# Patient Record
Sex: Male | Born: 1950
Health system: Southern US, Community
[De-identification: ages and names within clinical notes are randomized; demographics above are authoritative.]

## PROBLEM LIST (undated history)

## (undated) DIAGNOSIS — I10 Essential (primary) hypertension: Secondary | ICD-10-CM

## (undated) DIAGNOSIS — E7800 Pure hypercholesterolemia, unspecified: Secondary | ICD-10-CM

---

## 2014-02-24 ENCOUNTER — Encounter (HOSPITAL_COMMUNITY): Payer: Self-pay | Admitting: Emergency Medicine

## 2014-02-24 ENCOUNTER — Inpatient Hospital Stay (HOSPITAL_COMMUNITY)
Admission: EM | Admit: 2014-02-24 | Discharge: 2014-03-03 | DRG: 176 | Disposition: A | Discharge status: Home / Self Care | Payer: Self-pay | Attending: Internal Medicine | Admitting: Internal Medicine

## 2014-02-24 ENCOUNTER — Emergency Department (HOSPITAL_COMMUNITY): Payer: Self-pay

## 2014-02-24 DIAGNOSIS — R509 Fever, unspecified: Secondary | ICD-10-CM

## 2014-02-24 DIAGNOSIS — F17200 Nicotine dependence, unspecified, uncomplicated: Secondary | ICD-10-CM | POA: Diagnosis present

## 2014-02-24 DIAGNOSIS — I824Y9 Acute embolism and thrombosis of unspecified deep veins of unspecified proximal lower extremity: Secondary | ICD-10-CM | POA: Diagnosis present

## 2014-02-24 DIAGNOSIS — D72829 Elevated white blood cell count, unspecified: Secondary | ICD-10-CM

## 2014-02-24 DIAGNOSIS — I2699 Other pulmonary embolism without acute cor pulmonale: Principal | ICD-10-CM

## 2014-02-24 DIAGNOSIS — I82412 Acute embolism and thrombosis of left femoral vein: Secondary | ICD-10-CM

## 2014-02-24 DIAGNOSIS — I82419 Acute embolism and thrombosis of unspecified femoral vein: Secondary | ICD-10-CM

## 2014-02-24 LAB — CBC
HCT: 39 % (ref 39.0–52.0)
HEMOGLOBIN: 13.1 g/dL (ref 13.0–17.0)
MCH: 29.7 pg (ref 26.0–34.0)
MCHC: 33.6 g/dL (ref 30.0–36.0)
MCV: 88.4 fL (ref 78.0–100.0)
PLATELETS: 370 10*3/uL (ref 150–400)
RBC: 4.41 MIL/uL (ref 4.22–5.81)
RDW: 15.3 % (ref 11.5–15.5)
WBC: 13.5 10*3/uL — ABNORMAL HIGH (ref 4.0–10.5)

## 2014-02-24 LAB — URINE MICROSCOPIC-ADD ON

## 2014-02-24 LAB — BASIC METABOLIC PANEL
ANION GAP: 19 — AB (ref 5–15)
BUN: 7 mg/dL (ref 6–23)
CALCIUM: 9.2 mg/dL (ref 8.4–10.5)
CO2: 22 mEq/L (ref 19–32)
Chloride: 96 mEq/L (ref 96–112)
Creatinine, Ser: 1.09 mg/dL (ref 0.50–1.35)
GFR, EST AFRICAN AMERICAN: 83 mL/min — AB (ref >90)
GFR, EST NON AFRICAN AMERICAN: 71 mL/min — AB (ref >90)
Glucose, Bld: 101 mg/dL — ABNORMAL HIGH (ref 70–99)
POTASSIUM: 3.9 meq/L (ref 3.7–5.3)
SODIUM: 137 meq/L (ref 137–147)

## 2014-02-24 LAB — URINALYSIS, ROUTINE W REFLEX MICROSCOPIC
BILIRUBIN URINE: NEGATIVE
Glucose, UA: NEGATIVE mg/dL
KETONES UR: 15 mg/dL — AB
Leukocytes, UA: NEGATIVE
NITRITE: NEGATIVE
Protein, ur: NEGATIVE mg/dL
Specific Gravity, Urine: 1.018 (ref 1.005–1.030)
UROBILINOGEN UA: 2 mg/dL — AB (ref 0.0–1.0)
pH: 5.5 (ref 5.0–8.0)

## 2014-02-24 LAB — D-DIMER, QUANTITATIVE (NOT AT ARMC): D-Dimer, Quant: 3.37 ug/mL-FEU — ABNORMAL HIGH (ref 0.00–0.48)

## 2014-02-24 LAB — I-STAT TROPONIN, ED: TROPONIN I, POC: 0 ng/mL (ref 0.00–0.08)

## 2014-02-24 MED ORDER — HEPARIN (PORCINE) IN NACL 100-0.45 UNIT/ML-% IJ SOLN
1500.0000 [IU]/h | INTRAMUSCULAR | Status: DC
  Administered 2014-02-24: 1500 [IU]/h via INTRAVENOUS
  Filled 2014-02-24 (×2): qty 250

## 2014-02-24 MED ORDER — SODIUM CHLORIDE 0.9 % IV BOLUS (SEPSIS)
1000.0000 mL | Freq: Once | INTRAVENOUS | Status: AC
  Administered 2014-02-24: 1000 mL via INTRAVENOUS

## 2014-02-24 MED ORDER — HEPARIN BOLUS VIA INFUSION
5000.0000 [IU] | Freq: Once | INTRAVENOUS | Status: AC
  Administered 2014-02-24: 5000 [IU] via INTRAVENOUS
  Filled 2014-02-24: qty 5000

## 2014-02-24 MED ORDER — IOHEXOL 350 MG/ML SOLN
100.0000 mL | Freq: Once | INTRAVENOUS | Status: AC | PRN
  Administered 2014-02-24: 100 mL via INTRAVENOUS

## 2014-02-24 MED ORDER — DIAZEPAM 5 MG/ML IJ SOLN
5.0000 mg | Freq: Once | INTRAMUSCULAR | Status: AC
  Administered 2014-02-24: 5 mg via INTRAVENOUS
  Filled 2014-02-24: qty 2

## 2014-02-24 NOTE — ED Notes (Signed)
Presents with right flank/upper back pain began 2 days ago described as squeezing and breath catching. Pain is worse with movement and feels like "something is grabbing my ribs or lungs and squeezing"  Associate dwith SOB. Denies cough. bilaterral breath sounds clear. Denies nausea and vomiting. Sitting up straight makes pain better.

## 2014-02-24 NOTE — ED Notes (Signed)
PT reports flank pain is non-tender unless you push very hard. Non-tender upon assessment

## 2014-02-24 NOTE — Progress Notes (Signed)
ANTICOAGULATION CONSULT NOTE - Initial Consult  Pharmacy Consult for Heparin  Indication: pulmonary embolus  No Known Allergies  Patient Measurements: Weight: 262 lb (118.842 kg) Heparin Dosing Weight:   Vital Signs: Temp: 100 F (37.8 C) (07/07 1643) Temp src: Oral (07/07 1643) BP: 155/72 mmHg (07/07 2255) Pulse Rate: 88 (07/07 2255)  Labs:  Recent Labs  02/24/14 1706  HGB 13.1  HCT 39.0  PLT 370  CREATININE 1.09    Assessment: [name] with right flank pain, upper back pain x 2 days. + D-dimer, awaiting CT Angio, other labs as above.   Goal of Therapy:  Heparin level 0.3-0.7 units/ml Monitor platelets by anticoagulation protocol: Yes   Plan:  -Heparin 5000 units BOLUS  -Start heparin drip at 1500 units/hr -0700 HL -Daily CBC/HL -Monitor for bleeding  Jeffery Gamble, Jeffery Gamble 02/24/2014,11:24 PM

## 2014-02-24 NOTE — ED Notes (Signed)
Patient arrived back in room, hooked back up to the monitor and he states his pain has decreased from 10/10 to 4/10 and no complaints of shortness of breath.

## 2014-02-24 NOTE — ED Provider Notes (Signed)
CSN: [id number]     Arrival date & time 02/24/14  1636 History   First MD Initiated Contact with Patient 02/24/14 2009     Chief Complaint  Patient presents with  . Flank Pain  . Shortness of Breath     (Consider location/radiation/quality/duration/timing/severity/associated sxs/prior Treatment) HPI Comments: Patient is a 63 year old male past medical history significant for tobacco use presented to the emergency department for 2 days of right lateral chest pain that he describes as stabbing and squeezing. Patient states he has associated episodes of difficulty breathing due to the pain, he denies any shortness of breath otherwise. 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 He states the leg swelling has resolved. Medications tried prior to arrival: none. Denies any fevers, chills, nausea, vomiting, abdominal pain, diarrhea. No known cardiac history.   Patient is a 63 y.o. male presenting with flank pain and shortness of breath.  Flank Pain Associated symptoms include chest pain.  Shortness of Breath Associated symptoms: chest pain     History reviewed. No pertinent past medical history. History reviewed. No pertinent past surgical history. History reviewed. No pertinent family history. History  Substance Use Topics  . Smoking status: Current Every Day Smoker    Types: Cigarettes  . Smokeless tobacco: Not on file  . Alcohol Use: Yes    Review of Systems  Respiratory: Positive for shortness of breath.   Cardiovascular: Positive for chest pain.  All other systems reviewed and are negative.     Allergies  Review of patient's allergies indicates no known allergies.  Home Medications   Prior to Admission medications   Medication Sig Start Date End Date Taking? Authorizing Provider  oxymetazoline (AFRIN) 0.05 % nasal spray Place 2 sprays into both nostrils  daily as needed for congestion.   Yes Historical Provider, MD   BP 175/77  Pulse 88  Temp(Src) 100 F (37.8 C) (Oral)  Resp 27  Wt 262 lb (118.842 kg)  SpO2 93% Physical Exam  Vitals reviewed. Constitutional: He is oriented to person, place, and time. He appears well-developed and well-nourished. No distress.  HENT:  Head: Normocephalic and atraumatic.  Right Ear: External ear normal.  Left Ear: External ear normal.  Nose: Nose normal.  Mouth/Throat: Oropharynx is clear and moist. No oropharyngeal exudate.  Eyes: Conjunctivae are normal.  Neck: Neck supple.  Cardiovascular: Normal rate, regular rhythm, normal heart sounds and intact distal pulses.   Pulmonary/Chest: Effort normal and breath sounds normal. He exhibits tenderness.    Abdominal: Soft. Bowel sounds are normal. There is no tenderness.  Musculoskeletal: Normal range of motion. He exhibits no edema.       Back:  Lymphadenopathy:    He has no cervical adenopathy.  Neurological: He is alert and oriented to person, place, and time.  Skin: Skin is warm and dry. He is not diaphoretic.    ED Course  Procedures (including critical care time) Medications  heparin ADULT infusion 100 units/mL (25000 units/250 mL) (1,500 Units/hr Intravenous New Bag/Given 02/24/14 2358)  diazepam (VALIUM) injection 5 mg (5 mg Intravenous Given 02/24/14 2212)  sodium chloride 0.9 % bolus 1,000 mL (0 mLs Intravenous Stopped 02/24/14 2359)  iohexol (OMNIPAQUE) 350 MG/ML injection 100 mL (100 mLs Intravenous Contrast Given 02/24/14 2301)  heparin bolus via infusion 5,000 Units (5,000 Units Intravenous New Bag/Given 02/24/14 2359)    Labs Review  Labs Reviewed  CBC - Abnormal; Notable for the following:    WBC 13.5 (*)    All other components within normal limits  BASIC METABOLIC PANEL - Abnormal; Notable for the following:    Glucose, Bld 101 (*)    GFR calc non Af Amer 71 (*)    GFR calc Af Amer 83 (*)    Anion gap 19 (*)    All other  components within normal limits  URINALYSIS, ROUTINE W REFLEX MICROSCOPIC - Abnormal; Notable for the following:    Hgb urine dipstick SMALL (*)    Ketones, ur 15 (*)    Urobilinogen, UA 2.0 (*)    All other components within normal limits  D-DIMER, QUANTITATIVE - Abnormal; Notable for the following:    D-Dimer, Quant 3.37 (*)    All other components within normal limits  URINE CULTURE  URINE MICROSCOPIC-ADD ON  HEPARIN LEVEL (UNFRACTIONATED)  CBC  I-STAT TROPOININ, ED    Imaging Review Dg Chest 2 View  02/24/2014   CLINICAL DATA:  Chest and back pain.  EXAM: CHEST  2 VIEW  COMPARISON:  None.  FINDINGS: The cardiac silhouette, mediastinal and hilar contours are within normal limits. There is tortuosity and calcification of the thoracic aorta. Bibasilar subsegmental atelectasis versus scarring change. No focal airspace consolidation, pulmonary edema or pleural effusion. The bony thorax is intact.  IMPRESSION: Bibasilar atelectasis or scarring changes.   Electronically Signed   By: Loralie ChampagneMark  Gallerani M.D.   On: 02/24/2014 17:46   Ct Angio Chest W/cm &/or Wo Cm  02/25/2014   CLINICAL DATA:  Chest pain, shortness of breath  EXAM: CT ANGIOGRAPHY CHEST WITH CONTRAST  TECHNIQUE: Multidetector CT imaging of the chest was performed using the standard protocol during bolus administration of intravenous contrast. Multiplanar CT image reconstructions and MIPs were obtained to evaluate the vascular anatomy.  CONTRAST:  100mL OMNIPAQUE IOHEXOL 350 MG/ML SOLN  COMPARISON:  Prior radiograph performed earlier on the same day.  FINDINGS: Subcentimeter hypodense nodule noted within the left lobe of thyroid. The thyroid gland is otherwise unremarkable.  No pathologically enlarged mediastinal, hilar, or axillary lymph nodes are identified.  Intrathoracic aorta is of normal caliber and appearance. Scattered atherosclerotic plaque noted within the aortic arch. Great vessels within normal limits.  Heart size is normal.  No  pericardial effusion.  Pulmonary arterial tree is well opacified. Multiple filling defects are seen involving the segmental arteries of the right lower lobe, consistent with acute pulmonary emboli (series 4, image 52). There are nonocclusive emboli within the right upper lobe segmental branches as well (series 4, image 36). There is mild straightening of the interventricular septum with elevated RV to LV ratio of 1.4, suggesting right heart strain. Main pulmonary arteries also dilated tip 3.6 cm. No left-sided emboli.  Patchy infiltrate within the right lung base may reflect atelectasis or pulmonary infarct. Small layering right pleural effusion present. The left lung is clear.  Visualized upper abdomen within normal limits.  No acute osseous abnormality. No worrisome lytic or blastic osseous lesions.  Review of the MIP images confirms the above findings.  IMPRESSION: 1. Multiple acute pulmonary emboli involving the right lower and upper lobe segmental branches as above. Straightening of the interventricular septum, main pulmonary artery dilatation, and elevated RV to LV ratio suggests secondary right heart strain. 2. Patchy infiltrate within the right lung base, which may reflect atelectasis and/or pulmonary infarct. 3. Small layering right pleural effusion. Critical Value/emergent results were called by telephone at  the time of interpretation on 02/24/2014 at 11:58 PM to Dr. Francee Piccolo , who verbally acknowledged these results.   Electronically Signed   By: Rise Mu M.D.   On: 02/25/2014 00:04     EKG Interpretation   Date/Time:  Tuesday February 24 2014 16:48:55 EDT Ventricular Rate:  95 PR Interval:  132 QRS Duration: 80 QT Interval:  340 QTC Calculation: 427 R Axis:   9 Text Interpretation:  Normal sinus rhythm Normal ECG No previous ECGs  available Confirmed by Manus Gunning  MD, STEPHEN ([id number]) on 02/24/2014 10:54:08  PM       MDM   Final diagnoses:  Pulmonary emboli    Filed  Vitals:   02/24/14 2345  BP: 175/77  Pulse: 88  Temp:   Resp: 27   Afebrile, NAD, non-toxic appearing, AAOx4. Patient is not hypoxic no respiratory distress.  Patient with elevated D-dimer will obtain CT angio of chest.   I have reviewed nursing notes, vital signs, and all appropriate lab and imaging results for this patient.  CT angio of chest reveals multiple right-sided pulmonary emboli with possible infarction, noted right heart strain. IV Heparin ordered.  Discussed patient with on-call intensivist who recommends patient is stable at this time he will be pursued for hospitalist service, they will be willing to consult if status of patient changes. They recommend IV heparin be started, no TPA at this time. Discussed patient with hospitalist who will admit patient to the hospital for further management and evaluation. Patient d/w with Dr. Manus Gunning, agrees with plan.     Lise Auer Samule Life, PA-C 02/25/14 0040

## 2014-02-25 ENCOUNTER — Encounter (HOSPITAL_COMMUNITY): Payer: Self-pay | Admitting: *Deleted

## 2014-02-25 DIAGNOSIS — I2699 Other pulmonary embolism without acute cor pulmonale: Secondary | ICD-10-CM

## 2014-02-25 DIAGNOSIS — I369 Nonrheumatic tricuspid valve disorder, unspecified: Secondary | ICD-10-CM

## 2014-02-25 LAB — TROPONIN I: Troponin I: <0.3 ng/mL (ref <0.30)

## 2014-02-25 LAB — SEDIMENTATION RATE: Sed Rate: 75 mm/hr — ABNORMAL HIGH (ref 0–16)

## 2014-02-25 LAB — CBC
HEMATOCRIT: 35.5 % — AB (ref 39.0–52.0)
Hemoglobin: 11.7 g/dL — ABNORMAL LOW (ref 13.0–17.0)
MCH: 29.2 pg (ref 26.0–34.0)
MCHC: 33 g/dL (ref 30.0–36.0)
MCV: 88.5 fL (ref 78.0–100.0)
Platelets: 329 10*3/uL (ref 150–400)
RBC: 4.01 MIL/uL — AB (ref 4.22–5.81)
RDW: 15.4 % (ref 11.5–15.5)
WBC: 13.4 10*3/uL — AB (ref 4.0–10.5)

## 2014-02-25 LAB — HEPARIN LEVEL (UNFRACTIONATED)
Heparin Unfractionated: 0.21 IU/mL — ABNORMAL LOW (ref 0.30–0.70)
Heparin Unfractionated: 0.37 IU/mL (ref 0.30–0.70)

## 2014-02-25 LAB — CBC WITH DIFFERENTIAL/PLATELET
BASOS ABS: 0 10*3/uL (ref 0.0–0.1)
Basophils Relative: 0 % (ref 0–1)
Eosinophils Absolute: 0 10*3/uL (ref 0.0–0.7)
Eosinophils Relative: 0 % (ref 0–5)
HEMATOCRIT: 35.5 % — AB (ref 39.0–52.0)
Hemoglobin: 12 g/dL — ABNORMAL LOW (ref 13.0–17.0)
LYMPHS ABS: 2.7 10*3/uL (ref 0.7–4.0)
LYMPHS PCT: 19 % (ref 12–46)
MCH: 29.7 pg (ref 26.0–34.0)
MCHC: 33.8 g/dL (ref 30.0–36.0)
MCV: 87.9 fL (ref 78.0–100.0)
MONO ABS: 1.3 10*3/uL — AB (ref 0.1–1.0)
Monocytes Relative: 9 % (ref 3–12)
Neutro Abs: 10.2 10*3/uL — ABNORMAL HIGH (ref 1.7–7.7)
Neutrophils Relative %: 72 % (ref 43–77)
PLATELETS: 347 10*3/uL (ref 150–400)
RBC: 4.04 MIL/uL — ABNORMAL LOW (ref 4.22–5.81)
RDW: 15.4 % (ref 11.5–15.5)
WBC: 14.2 10*3/uL — AB (ref 4.0–10.5)

## 2014-02-25 LAB — COMPREHENSIVE METABOLIC PANEL
ALT: 12 U/L (ref 0–53)
AST: 14 U/L (ref 0–37)
Albumin: 2.8 g/dL — ABNORMAL LOW (ref 3.5–5.2)
Alkaline Phosphatase: 81 U/L (ref 39–117)
Anion gap: 16 — ABNORMAL HIGH (ref 5–15)
BUN: 7 mg/dL (ref 6–23)
CO2: 21 meq/L (ref 19–32)
CREATININE: 1.02 mg/dL (ref 0.50–1.35)
Calcium: 8.7 mg/dL (ref 8.4–10.5)
Chloride: 97 mEq/L (ref 96–112)
GFR, EST AFRICAN AMERICAN: 90 mL/min — AB (ref >90)
GFR, EST NON AFRICAN AMERICAN: 77 mL/min — AB (ref >90)
GLUCOSE: 117 mg/dL — AB (ref 70–99)
Potassium: 3.9 mEq/L (ref 3.7–5.3)
SODIUM: 134 meq/L — AB (ref 137–147)
TOTAL PROTEIN: 7.2 g/dL (ref 6.0–8.3)
Total Bilirubin: 0.6 mg/dL (ref 0.3–1.2)

## 2014-02-25 LAB — MRSA PCR SCREENING: MRSA BY PCR: NEGATIVE

## 2014-02-25 LAB — PSA: PSA: 17.11 ng/mL — AB (ref <=4.00)

## 2014-02-25 LAB — PRO B NATRIURETIC PEPTIDE: PRO B NATRI PEPTIDE: 30.8 pg/mL (ref 0–125)

## 2014-02-25 LAB — HOMOCYSTEINE: Homocysteine: 12.9 umol/L (ref 4.0–15.4)

## 2014-02-25 MED ORDER — HEPARIN (PORCINE) IN NACL 100-0.45 UNIT/ML-% IJ SOLN
2150.0000 [IU]/h | INTRAMUSCULAR | Status: DC
  Administered 2014-02-26: 2000 [IU]/h via INTRAVENOUS
  Administered 2014-02-26: 1800 [IU]/h via INTRAVENOUS
  Administered 2014-02-27: 2150 [IU]/h via INTRAVENOUS
  Administered 2014-02-27: 2000 [IU]/h via INTRAVENOUS
  Administered 2014-02-28 – 2014-03-03 (×7): 2150 [IU]/h via INTRAVENOUS
  Filled 2014-02-25 (×19): qty 250

## 2014-02-25 MED ORDER — SODIUM CHLORIDE 0.9 % IJ SOLN
3.0000 mL | Freq: Two times a day (BID) | INTRAMUSCULAR | Status: DC
  Administered 2014-02-25 – 2014-03-03 (×4): 3 mL via INTRAVENOUS

## 2014-02-25 MED ORDER — HEPARIN BOLUS VIA INFUSION
4000.0000 [IU] | Freq: Once | INTRAVENOUS | Status: AC
  Administered 2014-02-25: 4000 [IU] via INTRAVENOUS
  Filled 2014-02-25: qty 4000

## 2014-02-25 MED ORDER — MORPHINE SULFATE 2 MG/ML IJ SOLN
2.0000 mg | INTRAMUSCULAR | Status: DC | PRN
  Administered 2014-02-25 – 2014-02-26 (×5): 2 mg via INTRAVENOUS
  Filled 2014-02-25 (×5): qty 1

## 2014-02-25 MED ORDER — MORPHINE SULFATE 4 MG/ML IJ SOLN
4.0000 mg | Freq: Once | INTRAMUSCULAR | Status: AC
  Administered 2014-02-25: 4 mg via INTRAVENOUS
  Filled 2014-02-25: qty 1

## 2014-02-25 MED ORDER — SODIUM CHLORIDE 0.9 % IV SOLN
INTRAVENOUS | Status: DC
  Administered 2014-02-25 (×3): via INTRAVENOUS

## 2014-02-25 MED ORDER — OXYCODONE-ACETAMINOPHEN 5-325 MG PO TABS
1.0000 | ORAL_TABLET | Freq: Four times a day (QID) | ORAL | Status: DC | PRN
  Administered 2014-02-25 – 2014-02-26 (×3): 1 via ORAL
  Filled 2014-02-25 (×3): qty 1

## 2014-02-25 MED ORDER — ACETAMINOPHEN 325 MG PO TABS: 650.0000 mg | ORAL_TABLET | Freq: Four times a day (QID) | ORAL | Status: DC | PRN

## 2014-02-25 NOTE — ED Notes (Signed)
Report given to Stein St. PaulElven, RN

## 2014-02-25 NOTE — Progress Notes (Addendum)
ANTICOAGULATION CONSULT NOTE Pharmacy Consult for Heparin  Indication: pulmonary embolus  No Known Allergies  Patient Measurements: Height: 6\' 4"  (193 cm) Weight: 274 lb 11.1 oz (124.6 kg) IBW/kg (Calculated) : 86.8 Heparin Dosing Weight: 113kg  Vital Signs: Temp: 99.2 F (37.3 C) (07/08 1613) Temp src: Oral (07/08 1613) BP: 151/78 mmHg (07/08 1800) Pulse Rate: 71 (07/08 1800)  Labs:  Recent Labs  02/24/14 1706 02/25/14 0120 02/25/14 0712 02/25/14 0845 02/25/14 1255 02/25/14 1730  HGB 13.1 12.0*  --  11.7*  --   --   HCT 39.0 35.5*  --  35.5*  --   --   PLT 370 347  --  329  --   --   HEPARINUNFRC  --   --   --  0.21*  --  0.37  CREATININE 1.09 1.02  --   --   --   --   TROPONINI  --  <0.30 <0.30  --  <0.30  --     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 CT angio revealed multiple PE and also noted with LLE DVT.  Patient is on heparin and now at goal after heparin bolus and rate increase to 1800 units/hr.  Goal of Therapy:  Heparin level 0.3-0.7 units/ml Monitor platelets by anticoagulation protocol: Yes   Plan:   -No heparin changes needed -Will recheck a level later tonight to confirm  Harland GermanAndrew Kaniyah Lisby, Pharm D 02/25/2014 7:06 PM

## 2014-02-25 NOTE — ED Notes (Signed)
Patient expressed his frustration on the delay in getting his bed. He removed the cardiac monitor and stated he wanted to talk to the doctor because he was leaving AMA. Unable to reach the admitting physician so I spoke with the unit AD and he spoke to the patient and was able to get him to agree to stay. Recliner was obtained and put in the room for the patient.

## 2014-02-25 NOTE — Progress Notes (Signed)
RN paged NP secondary to pt now having 9/10 CP associated with + PEs on CTA chest. Was 0-1/10 pain earlier. Concern for RV strain. Echo ordered. VSS. Pain med ordered. NP called [facility] and spoke to Dr. Vassie LollAlva about new worsening pain to make him aware. PCCM has already consulted and will round.  Jeffery NormanKaren Kirby-Graham, NP [facility]

## 2014-02-25 NOTE — ED Provider Notes (Signed)
Medical screening examination/treatment/procedure(s) were conducted as a shared visit with non-physician practitioner(s) and myself.  I personally evaluated the patient during the encounter.  R chest pain going to back with SOB.  Hx long haul trucking.  +LLE swelling.  Multiple PE with R heart strain.  BP stable.  D/w PCCM who defer to hospitalist.  CRITICAL CARE Performed by: Glynn OctaveANCOUR, Georgianne Gritz Total critical care time: 30 Critical care time was exclusive of separately billable procedures and treating other patients. Critical care was necessary to treat or prevent imminent or life-threatening deterioration. Critical care was time spent personally by me on the following activities: development of treatment plan with patient and/or surrogate as well as nursing, discussions with consultants, evaluation of patient's response to treatment, examination of patient, obtaining history from patient or surrogate, ordering and performing treatments and interventions, ordering and review of laboratory studies, ordering and review of radiographic studies, pulse oximetry and re-evaluation of patient's condition.    EKG Interpretation   Date/Time:  Tuesday February 24 2014 16:48:55 EDT Ventricular Rate:  95 PR Interval:  132 QRS Duration: 80 QT Interval:  340 QTC Calculation: 427 R Axis:   9 Text Interpretation:  Normal sinus rhythm Normal ECG No previous ECGs  available Confirmed by Manus GunningANCOUR  MD, Tyrian Peart ([id number]) on 02/24/2014 10:54:08  PM       Glynn OctaveStephen Margalit Leece, MD 02/25/14 [id number]

## 2014-02-25 NOTE — Progress Notes (Signed)
Bilateral lower extremity venous duplex completed.  Right:  No evidence of DVT, superficial thrombosis, or Baker's cyst.  Left: DVT noted in the common femoral, profunda, femoral, popliteal, and posterior tibial veins.  No evidence of superficial thrombosis.  No Baker's cyst.

## 2014-02-25 NOTE — Progress Notes (Signed)
Echocardiogram 2D Echocardiogram has been performed.  Jeffery Gamble 02/25/2014, 9:36 AM

## 2014-02-25 NOTE — Progress Notes (Addendum)
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 CT angio revealed multiple PE in RUL/RLL with pulmonary artery dilation and concern for R heart strain. Has not received thrombolytics. Started on IV heparin- initial level this morning was drawn late, but was still low at 0.21 units/mL. No issues with line. Hgb and plts stable, no bleeding noted.  Goal of Therapy:  Heparin level 0.3-0.7 units/ml Monitor platelets by anticoagulation protocol: Yes   Plan:  1. Re-bolus with heparin 4000 units IV x1 2. increase heparin drip to 1800 units/hr 3. Heparin level in 6 hours 4. Daily CBC/HL 5. Monitor for s/s bleeding 6. Follow for long term AC plans  Lemon Whitacre D. Chrsitopher Wik, PharmD, BCPS Clinical Pharmacist Pager: [phone] 02/25/2014 9:49 AM

## 2014-02-25 NOTE — Progress Notes (Signed)
Notified Dr. Sunnie Nielsenegalado of venous doppler results and CCM.

## 2014-02-25 NOTE — Consult Note (Signed)
Name: Sultan Pargas MRN: [id number] DOB: May 18, 1951    ADMISSION DATE:  02/24/2014 CONSULTATION DATE:  02/25/14  REFERRING MD :  Hospitalist - Shanda Howells, MD PRIMARY SERVICE: Hospitalist/Family Medicine  CHIEF COMPLAINT:  Chest pain, Dyspnea  BRIEF PATIENT DESCRIPTION: 63 y.o M with PMH tobacco abuse. Presented with worsening CP / SOB x 2-3 days. Chest CTA multiple acute PE RUL / RLL with concern for R-heart strain.  SIGNIFICANT EVENTS / STUDIES:  7/7 - Admit, CP/SOB, elevated D-dimer 3.37       -  Chest CTA >> multiple acute PE RUL / RLL with pulm artery dilatation, concern for R-heart strain, also noted patchy infiltrate R-lung base, possible atelectasis vs pulm infarct  LINES / TUBES: PIVs  CULTURES: Blood x 2, 7/7 >> Urine 7/7 >>  ANTIBIOTICS: none  SUBJECTIVE: HISTORY OF PRESENT ILLNESS:  Patient reports worsening shortness of breath and chest pain for past 3 days. 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 States prolonged driving with >31 hrs at a time seated. Also complains of chronic bilateral lower ext edema (R>L).  Additionally, per chart review, patient is active smoker with tobacco abuse otherwise no prior medical issues, no regular PCP follow-up. In ED patient noted to be hemodynamically stable, no tachycardia, BP 140/70s, no O2 requirement (>96% on RA).  PAST MEDICAL HISTORY :  Denies any prior hx DVT / PE. Only admits prior hx Pneumonia 2007 (without hospitalization)   History reviewed. No pertinent past surgical history. Prior to Admission medications   Medication Sig Start Date End Date Taking? Authorizing Provider  oxymetazoline (AFRIN) 0.05 % nasal spray Place 2 sprays into both nostrils daily as needed for congestion.   Yes Historical Provider, MD   No Known Allergies  FAMILY HISTORY:  Denies family hx of blood clots, cancer, auto-immune syndromes. States hx family members passed at early age 53-30 years,  note Mother passed at young age (alcoholic).  SOCIAL HISTORY:  reports that he has been smoking Cigarettes.  He has been smoking about 0.00 packs per day. He does not have any smokeless tobacco history on file. He reports that he drinks alcohol. He reports that he does not use illicit drugs.  REVIEW OF SYSTEMS:   Admits fever to 100F yesterday.  Denies any lower ext edema or pain, no cough. Additionally, no unknown weight lost, fever/chills  VITAL SIGNS: Temp:  [100 F (37.8 C)] 100 F (37.8 C) (07/07 1643) Pulse Rate:  [80-89] 85 (07/08 0145) Resp:  [12-31] 24 (07/08 0145) BP: (140-195)/(70-110) 149/70 mmHg (07/08 0145) SpO2:  [92 %-97 %] 97 % (07/08 0145) Weight:  [262 lb (118.842 kg)] 262 lb (118.842 kg) (07/07 1643)  PHYSICAL EXAMINATION: General:  Well-appearing, comfortable, cooperative, NAD Neuro:  Awake, alert, oriented, grossly non-focal, muscle strength intact HEENT:  NCAT, PERRL, EOMI, moist MM Neck:  Supple Cardiovascular:  RRR, no murmurs, no RT heeve Lungs:  Mostly CTAB, R-lower lung field with crackles and mild diminished air movement. No respiratory distress or increased work of breathing. Speaks in full sentences Abdomen: soft, NTND Skin:  Warm, dry   Recent Labs Lab 02/24/14 1706 02/25/14 0120  NA 137 134*  K 3.9 3.9  CL 96 97  CO2 22 21  BUN 7 7  CREATININE 1.09 1.02  GLUCOSE 101* 117*    Recent Labs Lab 02/24/14 1706 02/25/14 0120  HGB 13.1 12.0*  HCT 39.0 35.5*  WBC 13.5* 14.2*  PLT 370 347  Dg Chest 2 View  02/24/2014   CLINICAL DATA:  Chest and back pain.  EXAM: CHEST  2 VIEW  COMPARISON:  None.  FINDINGS: The cardiac silhouette, mediastinal and hilar contours are within normal limits. There is tortuosity and calcification of the thoracic aorta. Bibasilar subsegmental atelectasis versus scarring change. No focal airspace consolidation, pulmonary edema or pleural effusion. The bony thorax is intact.  IMPRESSION: Bibasilar atelectasis or  scarring changes.   Electronically Signed   By: Kalman Jewels M.D.   On: 02/24/2014 17:46   Ct Angio Chest W/cm &/or Wo Cm  02/25/2014   CLINICAL DATA:  Chest pain, shortness of breath  EXAM: CT ANGIOGRAPHY CHEST WITH CONTRAST  TECHNIQUE: Multidetector CT imaging of the chest was performed using the standard protocol during bolus administration of intravenous contrast. Multiplanar CT image reconstructions and MIPs were obtained to evaluate the vascular anatomy.  CONTRAST:  189m OMNIPAQUE IOHEXOL 350 MG/ML SOLN  COMPARISON:  Prior radiograph performed earlier on the same day.  FINDINGS: Subcentimeter hypodense nodule noted within the left lobe of thyroid. The thyroid gland is otherwise unremarkable.  No pathologically enlarged mediastinal, hilar, or axillary lymph nodes are identified.  Intrathoracic aorta is of normal caliber and appearance. Scattered atherosclerotic plaque noted within the aortic arch. Great vessels within normal limits.  Heart size is normal.  No pericardial effusion.  Pulmonary arterial tree is well opacified. Multiple filling defects are seen involving the segmental arteries of the right lower lobe, consistent with acute pulmonary emboli (series 4, image 52). There are nonocclusive emboli within the right upper lobe segmental branches as well (series 4, image 36). There is mild straightening of the interventricular septum with elevated RV to LV ratio of 1.4, suggesting right heart strain. Main pulmonary arteries also dilated tip 3.6 cm. No left-sided emboli.  Patchy infiltrate within the right lung base may reflect atelectasis or pulmonary infarct. Small layering right pleural effusion present. The left lung is clear.  Visualized upper abdomen within normal limits.  No acute osseous abnormality. No worrisome lytic or blastic osseous lesions.  Review of the MIP images confirms the above findings.  IMPRESSION: 1. Multiple acute pulmonary emboli involving the right lower and upper lobe  segmental branches as above. Straightening of the interventricular septum, main pulmonary artery dilatation, and elevated RV to LV ratio suggests secondary right heart strain. 2. Patchy infiltrate within the right lung base, which may reflect atelectasis and/or pulmonary infarct. 3. Small layering right pleural effusion. Critical Value/emergent results were called by telephone at the time of interpretation on 02/24/2014 at 11:58 PM to Dr. JBaron Sane, who verbally acknowledged these results.   Electronically Signed   By: BJeannine BogaM.D.   On: 02/25/2014 00:04    ASSESSMENT / PLAN:  PULMONARY / VASCULAR A:  Acute Multiple PE (Right sided), concern RV strain - Hemodynamically stable, 1st episode VTE Chronic venous stasis, bilateral LE - Concern inc risk future DVT Truck driver with prolonged immobility  P: - Agree with SDU status - Continue Heparin gtt - Anticipate transition to PO anticoagulation with overlap 4-5 days, recommend Xarelto if affordable - Bilateral Duplex [place] eval DVT, if mobile thrombus would recommend IVC filter placement - f/u ECHO, eval for RV strain - Given patient hemodynamically stable, essentially asymptomatic, without large proximal clot burden, would not recommend targeted thrombolytic therapy at this time. 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    And edited infull  Lavon Paganini. Titus Mould, MD, [place] Pgr: [facility]

## 2014-02-25 NOTE — Progress Notes (Signed)
UR Completed.  Aqsa Sensabaugh Jane [phone] 02/25/2014  

## 2014-02-25 NOTE — Progress Notes (Signed)
[facility] PROGRESS NOTE  Jeffery Gamble XNA:[id number] DOB: 04-Oct-1950 DOA: 02/24/2014 PCP: No primary provider on file.  Assessment/Plan: 1-Multiple acute Pulmonary embolism: Right side heart strain on CT angio. ECHO and doppler LE order. Continue with heparin Gtt. Pulmonary following. ANA, Homocysteine, factor 5 leiden, pending. ESR at 75.   2-Leukocytosis: UA negative, Blood culture pending. Urine culture pending.  3-Mild fever; could be related to PE.    Code Status: Full Code.  Family Communication: care discussed with patient.  Disposition Plan: transfer to telemetry this afternoon if vital remain stable.    Consultants:  CCM  Procedures:  ECHO; Pending.   Doppler ;pending.   Antibiotics:  none  HPI/Subjective: Denies chest pain at this time, morphine help. No SOB. He had some LE edema last week.   Objective: Filed Vitals:   02/25/14 0718  BP: 167/79  Pulse: 77  Temp: 100.4 F (38 C)  Resp: 21    Intake/Output Summary (Last 24 hours) at 02/25/14 0825 Last data filed at 02/25/14 0700  Gross per 24 hour  Intake 1105.5 ml  Output    300 ml  Net  805.5 ml   Filed Weights   02/24/14 1643 02/25/14 0507  Weight: 118.842 kg (262 lb) 124.6 kg (274 lb 11.1 oz)    Exam:   General:  No acute distress.   Cardiovascular: S 1, S 2 RRR  Respiratory: CTA  Abdomen: Bs present, soft, NT  Musculoskeletal: trace edema.   Data Reviewed: Basic Metabolic Panel:  Recent Labs Lab 02/24/14 1706 02/25/14 0120  NA 137 134*  K 3.9 3.9  CL 96 97  CO2 22 21  GLUCOSE 101* 117*  BUN 7 7  CREATININE 1.09 1.02  CALCIUM 9.2 8.7   Liver Function Tests:  Recent Labs Lab 02/25/14 0120  AST 14  ALT 12  ALKPHOS 81  BILITOT 0.6  PROT 7.2  ALBUMIN 2.8*   No results found for this basename: LIPASE, AMYLASE,  in the last 168 hours No results found for this basename: AMMONIA,  in the last 168 hours CBC:  Recent Labs Lab 02/24/14 1706 02/25/14 0120   WBC 13.5* 14.2*  NEUTROABS  --  10.2*  HGB 13.1 12.0*  HCT 39.0 35.5*  MCV 88.4 87.9  PLT 370 347   Cardiac Enzymes:  Recent Labs Lab 02/25/14 0120  TROPONINI <0.30   BNP (last 3 results)  Recent Labs  02/25/14 0120  PROBNP 30.8   CBG: No results found for this basename: GLUCAP,  in the last 168 hours  Recent Results (from the past 240 hour(s))  MRSA PCR SCREENING     Status: None   Collection Time    02/25/14  4:56 AM      Result Value Ref Range Status   MRSA by PCR NEGATIVE  NEGATIVE Final   Comment:            The GeneXpert MRSA Assay (FDA     approved for NASAL specimens     only), is one component of a     comprehensive MRSA colonization     surveillance program. It is not     intended to diagnose MRSA     infection nor to guide or     monitor treatment for     MRSA infections.     Studies: Dg Chest 2 View  02/24/2014   CLINICAL DATA:  Chest and back pain.  EXAM: CHEST  2 VIEW  COMPARISON:  None.  FINDINGS: The  cardiac silhouette, mediastinal and hilar contours are within normal limits. There is tortuosity and calcification of the thoracic aorta. Bibasilar subsegmental atelectasis versus scarring change. No focal airspace consolidation, pulmonary edema or pleural effusion. The bony thorax is intact.  IMPRESSION: Bibasilar atelectasis or scarring changes.   Electronically Signed   By: Kalman Jewels M.D.   On: 02/24/2014 17:46   Ct Angio Chest W/cm &/or Wo Cm  02/25/2014   CLINICAL DATA:  Chest pain, shortness of breath  EXAM: CT ANGIOGRAPHY CHEST WITH CONTRAST  TECHNIQUE: Multidetector CT imaging of the chest was performed using the standard protocol during bolus administration of intravenous contrast. Multiplanar CT image reconstructions and MIPs were obtained to evaluate the vascular anatomy.  CONTRAST:  153m OMNIPAQUE IOHEXOL 350 MG/ML SOLN  COMPARISON:  Prior radiograph performed earlier on the same day.  FINDINGS: Subcentimeter hypodense nodule noted within  the left lobe of thyroid. The thyroid gland is otherwise unremarkable.  No pathologically enlarged mediastinal, hilar, or axillary lymph nodes are identified.  Intrathoracic aorta is of normal caliber and appearance. Scattered atherosclerotic plaque noted within the aortic arch. Great vessels within normal limits.  Heart size is normal.  No pericardial effusion.  Pulmonary arterial tree is well opacified. Multiple filling defects are seen involving the segmental arteries of the right lower lobe, consistent with acute pulmonary emboli (series 4, image 52). There are nonocclusive emboli within the right upper lobe segmental branches as well (series 4, image 36). There is mild straightening of the interventricular septum with elevated RV to LV ratio of 1.4, suggesting right heart strain. Main pulmonary arteries also dilated tip 3.6 cm. No left-sided emboli.  Patchy infiltrate within the right lung base may reflect atelectasis or pulmonary infarct. Small layering right pleural effusion present. The left lung is clear.  Visualized upper abdomen within normal limits.  No acute osseous abnormality. No worrisome lytic or blastic osseous lesions.  Review of the MIP images confirms the above findings.  IMPRESSION: 1. Multiple acute pulmonary emboli involving the right lower and upper lobe segmental branches as above. Straightening of the interventricular septum, main pulmonary artery dilatation, and elevated RV to LV ratio suggests secondary right heart strain. 2. Patchy infiltrate within the right lung base, which may reflect atelectasis and/or pulmonary infarct. 3. Small layering right pleural effusion. Critical Value/emergent results were called by telephone at the time of interpretation on 02/24/2014 at 11:58 PM to Dr. JBaron Sane, who verbally acknowledged these results.   Electronically Signed   By: BJeannine BogaM.D.   On: 02/25/2014 00:04    Scheduled Meds: . sodium chloride  3 mL Intravenous Q12H    Continuous Infusions: . sodium chloride 100 mL/hr at 02/25/14 0503  . heparin 1,500 Units/hr (02/24/14 2358)    Active Problems:   PE (pulmonary embolism)    Time spent: 35 minutes.     RNiel HummerA  [facility] Pager [phone] If 7PM-7AM, please contact night-coverage at [email or web address], password [facility]7/03/2014, 8:25 AM  LOS: 1 day

## 2014-02-25 NOTE — H&P (Signed)
Hospitalist Admission History and Physical  Patient name: Jeffery Gamble Medical record number: [id number] Date of birth: 07/07/1951 Age: 63 y.o. Gender: male  Primary Care Provider: No primary provider on file.  Chief Complaint: PE  History of Present Illness:This is a 63 y.o. year old male with significant past medical history of tobacco abuse presenting with PE. Pt states that he has had progressive SOB and CP for the past 2-3 days. 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 1 PPD smoker. Had worsening SOB today. No prior medical history. Pt states this is the first time he has been to the doctor. Presented to ER with sxs. Hemodynamically stable. Satting >92% on RA.  Bloodwork essentially WNL apart from WBC 13.6. CT shows multiple R sided PEs in upper lobe and lower lobe. Noted pulmonary artery dilatation, R sided heart strain on CT, patchy infiltrate concerning for atelectasis vs. Pulmonary infarct with small R pleural effusion.   Assessment and Plan: Jeffery Gamble is a 63 y.o. year old male presenting with PE  PE: Heparin gtt. Hemodynamically stable. Currently no resp compromise. Critical care c/s. ? Thrombolytics given R heart strain on CT. F/u on PCCM recs. 2D ECHO. Stepdown bed.   Leukocytosis: likely reactive in setting of PE. Pan culture. Follow.    FEN/GI: heart healthy diet.  Prophylaxis: heparin gtt Disposition: pending further eval  Code Status:Full Code   Patient Active Problem List   Diagnosis Date Noted  . PE (pulmonary embolism) 02/25/2014   Past Medical History: History reviewed. No pertinent past medical history.  Past Surgical History: History reviewed. No pertinent past surgical history.  Social History: History   Social History  . Marital Status: Married    Spouse Name: N/A    Number of Children: N/A  . Years of Education: N/A   Social History Main Topics  . Smoking status: Current Every Day Smoker    Types: Cigarettes  . Smokeless tobacco: None  . Alcohol Use: Yes   . Drug Use: No  . Sexual Activity: None   Other Topics Concern  . None   Social History Narrative  . None    Family History: History reviewed. No pertinent family history.  Allergies: No Known Allergies  Current Facility-Administered Medications  Medication Dose Route Frequency Provider Last Rate Last Dose  . 0.9 %  sodium chloride infusion   Intravenous Continuous Doree AlbeeSteven Julez Huseby, MD      . heparin ADULT infusion 100 units/mL (25000 units/250 mL)  1,500 Units/hr Intravenous Continuous Abran DukeJames Ledford, RPH 15 mL/hr at 02/24/14 2358 1,500 Units/hr at 02/24/14 2358  . sodium chloride 0.9 % injection 3 mL  3 mL Intravenous Q12H Doree AlbeeSteven Jasleen Riepe, MD       Current Outpatient Prescriptions  Medication Sig Dispense Refill  . oxymetazoline (AFRIN) 0.05 % nasal spray Place 2 sprays into both nostrils daily as needed for congestion.       Review Of Systems: 12 point ROS negative except as noted above in HPI.  Physical Exam: Filed Vitals:   02/24/14 2345  BP: 175/77  Pulse: 88  Temp:   Resp: 27    General: alert and cooperative HEENT: PERRLA and extra ocular movement intact Heart: S1, S2 normal, no murmur, rub or gallop, regular rate and rhythm Lungs: clear to auscultation, no wheezes or rales and unlabored breathing Abdomen: abdomen is soft without significant tenderness, masses, organomegaly or guarding Extremities: extremities normal, atraumatic, no cyanosis or edema Skin:no rashes, no ecchymoses Neurology: normal without focal findings  Labs and Imaging: Lab  Results  Component Value Date/Time   NA 137 02/24/2014  5:06 PM   K 3.9 02/24/2014  5:06 PM   CL 96 02/24/2014  5:06 PM   CO2 22 02/24/2014  5:06 PM   BUN 7 02/24/2014  5:06 PM   CREATININE 1.09 02/24/2014  5:06 PM   GLUCOSE 101* 02/24/2014  5:06 PM   Lab Results  Component Value Date   WBC 13.5* 02/24/2014   HGB 13.1 02/24/2014   HCT 39.0 02/24/2014   MCV 88.4 02/24/2014   PLT 370 02/24/2014    Dg Chest 2 View  02/24/2014    CLINICAL DATA:  Chest and back pain.  EXAM: CHEST  2 VIEW  COMPARISON:  None.  FINDINGS: The cardiac silhouette, mediastinal and hilar contours are within normal limits. There is tortuosity and calcification of the thoracic aorta. Bibasilar subsegmental atelectasis versus scarring change. No focal airspace consolidation, pulmonary edema or pleural effusion. The bony thorax is intact.  IMPRESSION: Bibasilar atelectasis or scarring changes.   Electronically Signed   By: Loralie ChampagneMark  Gallerani M.D.   On: 02/24/2014 17:46   Ct Angio Chest W/cm &/or Wo Cm  02/25/2014   CLINICAL DATA:  Chest pain, shortness of breath  EXAM: CT ANGIOGRAPHY CHEST WITH CONTRAST  TECHNIQUE: Multidetector CT imaging of the chest was performed using the standard protocol during bolus administration of intravenous contrast. Multiplanar CT image reconstructions and MIPs were obtained to evaluate the vascular anatomy.  CONTRAST:  100mL OMNIPAQUE IOHEXOL 350 MG/ML SOLN  COMPARISON:  Prior radiograph performed earlier on the same day.  FINDINGS: Subcentimeter hypodense nodule noted within the left lobe of thyroid. The thyroid gland is otherwise unremarkable.  No pathologically enlarged mediastinal, hilar, or axillary lymph nodes are identified.  Intrathoracic aorta is of normal caliber and appearance. Scattered atherosclerotic plaque noted within the aortic arch. Great vessels within normal limits.  Heart size is normal.  No pericardial effusion.  Pulmonary arterial tree is well opacified. Multiple filling defects are seen involving the segmental arteries of the right lower lobe, consistent with acute pulmonary emboli (series 4, image 52). There are nonocclusive emboli within the right upper lobe segmental branches as well (series 4, image 36). There is mild straightening of the interventricular septum with elevated RV to LV ratio of 1.4, suggesting right heart strain. Main pulmonary arteries also dilated tip 3.6 cm. No left-sided emboli.  Patchy  infiltrate within the right lung base may reflect atelectasis or pulmonary infarct. Small layering right pleural effusion present. The left lung is clear.  Visualized upper abdomen within normal limits.  No acute osseous abnormality. No worrisome lytic or blastic osseous lesions.  Review of the MIP images confirms the above findings.  IMPRESSION: 1. Multiple acute pulmonary emboli involving the right lower and upper lobe segmental branches as above. Straightening of the interventricular septum, main pulmonary artery dilatation, and elevated RV to LV ratio suggests secondary right heart strain. 2. Patchy infiltrate within the right lung base, which may reflect atelectasis and/or pulmonary infarct. 3. Small layering right pleural effusion. Critical Value/emergent results were called by telephone at the time of interpretation on 02/24/2014 at 11:58 PM to Dr. Francee PiccoloJENNIFER PIEPENBRINK , who verbally acknowledged these results.   Electronically Signed   By: Rise MuBenjamin  McClintock M.D.   On: 02/25/2014 00:04           Doree AlbeeSteven Emogene Muratalla MD  Pager: [phone]

## 2014-02-25 NOTE — Plan of Care (Signed)
Problem: Consults Goal: General Medical Patient Education See Patient Education Module for specific education. Outcome: Completed/Met Date Met:  02/25/14 Pulmonary emboli

## 2014-02-26 DIAGNOSIS — I82419 Acute embolism and thrombosis of unspecified femoral vein: Secondary | ICD-10-CM

## 2014-02-26 DIAGNOSIS — R509 Fever, unspecified: Secondary | ICD-10-CM

## 2014-02-26 DIAGNOSIS — I824Y9 Acute embolism and thrombosis of unspecified deep veins of unspecified proximal lower extremity: Secondary | ICD-10-CM

## 2014-02-26 DIAGNOSIS — D72829 Elevated white blood cell count, unspecified: Secondary | ICD-10-CM

## 2014-02-26 LAB — URINE CULTURE
COLONY COUNT: NO GROWTH
Culture: NO GROWTH

## 2014-02-26 LAB — CBC
HCT: 34.3 % — ABNORMAL LOW (ref 39.0–52.0)
Hemoglobin: 11.2 g/dL — ABNORMAL LOW (ref 13.0–17.0)
MCH: 29.2 pg (ref 26.0–34.0)
MCHC: 32.7 g/dL (ref 30.0–36.0)
MCV: 89.3 fL (ref 78.0–100.0)
PLATELETS: 332 10*3/uL (ref 150–400)
RBC: 3.84 MIL/uL — AB (ref 4.22–5.81)
RDW: 15.6 % — ABNORMAL HIGH (ref 11.5–15.5)
WBC: 11.8 10*3/uL — ABNORMAL HIGH (ref 4.0–10.5)

## 2014-02-26 LAB — COMPREHENSIVE METABOLIC PANEL
ALT: 14 U/L (ref 0–53)
AST: 16 U/L (ref 0–37)
Albumin: 2.6 g/dL — ABNORMAL LOW (ref 3.5–5.2)
Alkaline Phosphatase: 78 U/L (ref 39–117)
Anion gap: 14 (ref 5–15)
BUN: 9 mg/dL (ref 6–23)
CALCIUM: 8.3 mg/dL — AB (ref 8.4–10.5)
CO2: 24 meq/L (ref 19–32)
CREATININE: 1.23 mg/dL (ref 0.50–1.35)
Chloride: 103 mEq/L (ref 96–112)
GFR calc Af Amer: 72 mL/min — ABNORMAL LOW (ref >90)
GFR, EST NON AFRICAN AMERICAN: 62 mL/min — AB (ref >90)
Glucose, Bld: 121 mg/dL — ABNORMAL HIGH (ref 70–99)
Potassium: 4.1 mEq/L (ref 3.7–5.3)
SODIUM: 141 meq/L (ref 137–147)
Total Bilirubin: 0.2 mg/dL — ABNORMAL LOW (ref 0.3–1.2)
Total Protein: 7 g/dL (ref 6.0–8.3)

## 2014-02-26 LAB — GLUCOSE, CAPILLARY: Glucose-Capillary: 98 mg/dL (ref 70–99)

## 2014-02-26 LAB — HEPARIN LEVEL (UNFRACTIONATED)
HEPARIN UNFRACTIONATED: 0.3 [IU]/mL (ref 0.30–0.70)
HEPARIN UNFRACTIONATED: 0.43 [IU]/mL (ref 0.30–0.70)
HEPARIN UNFRACTIONATED: 0.29 [IU]/mL — AB (ref 0.30–0.70)

## 2014-02-26 LAB — ANA: ANA: NEGATIVE

## 2014-02-26 MED ORDER — WARFARIN - PHARMACIST DOSING INPATIENT
Freq: Every day | Status: DC
  Administered 2014-03-02: 18:00:00

## 2014-02-26 MED ORDER — HYDROMORPHONE HCL PF 2 MG/ML IJ SOLN: 1.0000 mg | INTRAMUSCULAR | Status: DC | PRN

## 2014-02-26 MED ORDER — PATIENT'S GUIDE TO USING COUMADIN BOOK
Freq: Once | Status: AC
  Administered 2014-02-26: 18:00:00
  Filled 2014-02-26 (×2): qty 1

## 2014-02-26 MED ORDER — WARFARIN SODIUM 10 MG PO TABS
10.0000 mg | ORAL_TABLET | Freq: Once | ORAL | Status: AC
  Administered 2014-02-26: 10 mg via ORAL
  Filled 2014-02-26 (×2): qty 1

## 2014-02-26 MED ORDER — WARFARIN VIDEO
Freq: Once | Status: AC
  Administered 2014-02-26: 18:00:00

## 2014-02-26 MED ORDER — HYDROMORPHONE HCL PF 1 MG/ML IJ SOLN
1.0000 mg | INTRAMUSCULAR | Status: DC | PRN
  Administered 2014-02-27: 1 mg via INTRAVENOUS
  Filled 2014-02-26: qty 1

## 2014-02-26 MED ORDER — OXYCODONE-ACETAMINOPHEN 5-325 MG PO TABS
1.0000 | ORAL_TABLET | Freq: Four times a day (QID) | ORAL | Status: DC | PRN
  Administered 2014-02-26 – 2014-02-28 (×8): 2 via ORAL
  Filled 2014-02-26 (×10): qty 2

## 2014-02-26 NOTE — Progress Notes (Signed)
ANTICOAGULATION CONSULT NOTE - Follow Up Consult  Pharmacy Consult for heparin Indication: pulmonary embolus  Labs:  Recent Labs  02/24/14 1706 02/25/14 0120 02/25/14 0712 02/25/14 0845 02/25/14 1255 02/25/14 1730 02/25/14 2355  HGB 13.1 12.0*  --  11.7*  --   --   --   HCT 39.0 35.5*  --  35.5*  --   --   --   PLT 370 347  --  329  --   --   --   HEPARINUNFRC  --   --   --  0.21*  --  0.37 0.29*  CREATININE 1.09 1.02  --   --   --   --   --   TROPONINI  --  <0.30 <0.30  --  <0.30  --   --     Assessment: 63yo male now slightly subtherapeutic on heparin after one level at lower end of goal.  Goal of Therapy:  Heparin level 0.3-0.7 units/ml   Plan:  Will increase heparin gtt by 1-2 units/kg/hr to 2000 units/hr and check level in 6hr.  Vernard GamblesVeronda Deborrah Mabin, PharmD, BCPS  02/26/2014,1:02 AM

## 2014-02-26 NOTE — Progress Notes (Signed)
[facility] PROGRESS NOTE  Jeffery Gamble MSX:[id number] DOB: 11/20/1950 DOA: 02/24/2014 PCP: No primary provider on file.  Assessment/Plan: 1-Multiple acute Pulmonary embolism: Right side heart strain on CT angio. ECHO with possible right heart strain finding.  Doppler; preliminary report with DVT in common femoral, profunda and popliteal and posterior tibial veins.  -Continue with heparin Gtt. Pulmonary following.  -ANA pending, Homocysteine: 12, factor 5 leiden, pending. ESR at 75. PSA at 17.  -Complaining of chest pain, not controlled. Will increase percocet dose, change morphine to dilaudid.   2-Leukocytosis: UA negative, Blood culture pending. Urine culture pending. WBC trending down.  3-Mild fever; could be related to PE. Afebrile.  4-Increase PSA; He will need further evaluation and urology evaluation.   Code Status: Full Code.  Family Communication: care discussed with patient.  Disposition Plan: Remain step down, requiring oxygen overnight. Pain not controlled.    Consultants:  CCM  Procedures: ECHO: - LVEF 60-65%, mildly dilated RV with indeterminate function, mild TR, RVSP 48 mmHg, moderately dilated RA. findings could be consistent with right heart strain in the setting of pulmonary embolus. No prior echo for comparison.  Doppler: Bilateral lower extremity venous duplex completed. Right: No evidence of DVT, superficial thrombosis, or Baker's cyst. Left: DVT noted in the common femoral, profunda, femoral, popliteal, and posterior tibial veins. No evidence of superficial thrombosis. No Baker's cyst.    Antibiotics:  none  HPI/Subjective: He is complaining of chest pain, intermittent. Was not able to sleep overnight. Morphine doesn't last long. He was started on 2 L overnight.   Objective: Filed Vitals:   02/26/14 0350  BP: 140/60  Pulse:   Temp:   Resp:     Intake/Output Summary (Last 24 hours) at 02/26/14 0727 Last data filed at 02/26/14 0500  Gross  per 24 hour  Intake 3098.44 ml  Output   1575 ml  Net 1523.44 ml   Filed Weights   02/24/14 1643 02/25/14 0507 02/26/14 0300  Weight: 118.842 kg (262 lb) 124.6 kg (274 lb 11.1 oz) 126.1 kg (278 lb)    Exam:   General:  No acute distress.   Cardiovascular: S 1, S 2 RRR  Respiratory: CTA  Abdomen: Bs present, soft, NT  Musculoskeletal: trace edema.   Data Reviewed: Basic Metabolic Panel:  Recent Labs Lab 02/24/14 1706 02/25/14 0120 02/26/14 0222  NA 137 134* 141  K 3.9 3.9 4.1  CL 96 97 103  CO2 '22 21 24  ' GLUCOSE 101* 117* 121*  BUN '7 7 9  ' CREATININE 1.09 1.02 1.23  CALCIUM 9.2 8.7 8.3*   Liver Function Tests:  Recent Labs Lab 02/25/14 0120 02/26/14 0222  AST 14 16  ALT 12 14  ALKPHOS 81 78  BILITOT 0.6 0.2*  PROT 7.2 7.0  ALBUMIN 2.8* 2.6*   No results found for this basename: LIPASE, AMYLASE,  in the last 168 hours No results found for this basename: AMMONIA,  in the last 168 hours CBC:  Recent Labs Lab 02/24/14 1706 02/25/14 0120 02/25/14 0845 02/26/14 0222  WBC 13.5* 14.2* 13.4* 11.8*  NEUTROABS  --  10.2*  --   --   HGB 13.1 12.0* 11.7* 11.2*  HCT 39.0 35.5* 35.5* 34.3*  MCV 88.4 87.9 88.5 89.3  PLT 370 347 329 332   Cardiac Enzymes:  Recent Labs Lab 02/25/14 0120 02/25/14 0712 02/25/14 1255  TROPONINI <0.30 <0.30 <0.30   BNP (last 3 results)  Recent Labs  02/25/14 0120  PROBNP 30.8   CBG:  No results found for this basename: GLUCAP,  in the last 168 hours  Recent Results (from the past 240 hour(s))  MRSA PCR SCREENING     Status: None   Collection Time    02/25/14  4:56 AM      Result Value Ref Range Status   MRSA by PCR NEGATIVE  NEGATIVE Final   Comment:            The GeneXpert MRSA Assay (FDA     approved for NASAL specimens     only), is one component of a     comprehensive MRSA colonization     surveillance program. It is not     intended to diagnose MRSA     infection nor to guide or     monitor treatment  for     MRSA infections.     Studies: Dg Chest 2 View  02/24/2014   CLINICAL DATA:  Chest and back pain.  EXAM: CHEST  2 VIEW  COMPARISON:  None.  FINDINGS: The cardiac silhouette, mediastinal and hilar contours are within normal limits. There is tortuosity and calcification of the thoracic aorta. Bibasilar subsegmental atelectasis versus scarring change. No focal airspace consolidation, pulmonary edema or pleural effusion. The bony thorax is intact.  IMPRESSION: Bibasilar atelectasis or scarring changes.   Electronically Signed   By: Kalman Jewels M.D.   On: 02/24/2014 17:46   Ct Angio Chest W/cm &/or Wo Cm  02/25/2014   CLINICAL DATA:  Chest pain, shortness of breath  EXAM: CT ANGIOGRAPHY CHEST WITH CONTRAST  TECHNIQUE: Multidetector CT imaging of the chest was performed using the standard protocol during bolus administration of intravenous contrast. Multiplanar CT image reconstructions and MIPs were obtained to evaluate the vascular anatomy.  CONTRAST:  135m OMNIPAQUE IOHEXOL 350 MG/ML SOLN  COMPARISON:  Prior radiograph performed earlier on the same day.  FINDINGS: Subcentimeter hypodense nodule noted within the left lobe of thyroid. The thyroid gland is otherwise unremarkable.  No pathologically enlarged mediastinal, hilar, or axillary lymph nodes are identified.  Intrathoracic aorta is of normal caliber and appearance. Scattered atherosclerotic plaque noted within the aortic arch. Great vessels within normal limits.  Heart size is normal.  No pericardial effusion.  Pulmonary arterial tree is well opacified. Multiple filling defects are seen involving the segmental arteries of the right lower lobe, consistent with acute pulmonary emboli (series 4, image 52). There are nonocclusive emboli within the right upper lobe segmental branches as well (series 4, image 36). There is mild straightening of the interventricular septum with elevated RV to LV ratio of 1.4, suggesting right heart strain. Main  pulmonary arteries also dilated tip 3.6 cm. No left-sided emboli.  Patchy infiltrate within the right lung base may reflect atelectasis or pulmonary infarct. Small layering right pleural effusion present. The left lung is clear.  Visualized upper abdomen within normal limits.  No acute osseous abnormality. No worrisome lytic or blastic osseous lesions.  Review of the MIP images confirms the above findings.  IMPRESSION: 1. Multiple acute pulmonary emboli involving the right lower and upper lobe segmental branches as above. Straightening of the interventricular septum, main pulmonary artery dilatation, and elevated RV to LV ratio suggests secondary right heart strain. 2. Patchy infiltrate within the right lung base, which may reflect atelectasis and/or pulmonary infarct. 3. Small layering right pleural effusion. Critical Value/emergent results were called by telephone at the time of interpretation on 02/24/2014 at 11:58 PM to Dr. JBaron Sane, who verbally acknowledged these  results.   Electronically Signed   By: Jeannine Boga M.D.   On: 02/25/2014 00:04    Scheduled Meds: . sodium chloride  3 mL Intravenous Q12H   Continuous Infusions: . sodium chloride 50 mL/hr at 02/25/14 1853  . heparin 2,000 Units/hr (02/26/14 0111)    Active Problems:   PE (pulmonary embolism)    Time spent: 35 minutes.     Niel Hummer A  [facility] Pager [phone]. If 7PM-7AM, please contact night-coverage at [email or web address], password [facility] 02/26/2014, 7:27 AM  LOS: 2 days

## 2014-02-26 NOTE — Consult Note (Signed)
Name: Lexie Morini MRN: [id number] DOB: 08-23-1950    ADMISSION DATE:  02/24/2014 CONSULTATION DATE:  02/25/14  REFERRING MD :  Hospitalist - Doree Albee, MD PRIMARY SERVICE: Hospitalist/Family Medicine  CHIEF COMPLAINT:  Chest pain, Dyspnea  BRIEF PATIENT DESCRIPTION: 63 y.o M with PMH tobacco abuse. Presented with worsening CP / SOB x 2-3 days. Chest CTA multiple acute PE RUL / RLL with concern for R-heart strain.  SIGNIFICANT EVENTS / STUDIES:  7/7 - Admit, CP/SOB, elevated D-dimer 3.37       -  Chest CTA >> multiple acute PE RUL / RLL with pulm artery dilatation, concern for R-heart strain, also noted patchy infiltrate R-lung base, possible atelectasis vs pulm infarct 7/8 dopplers>>>Left: DVT noted in the common femoral, profunda, femoral, popliteal, and posterior tibial veins 7/8 echo>>>LVEF 60-65%, mildly dilated RV with indeterminate function, mild TR, RVSP 48 mmHg, moderately dilated RA.   LINES / TUBES: PIVs  CULTURES: Blood x 2, 7/7 >> Urine 7/7 >>  ANTIBIOTICS: none  SUBJECTIVE: chest pain  VITAL SIGNS: Temp:  [98.5 F (36.9 C)-100 F (37.8 C)] 98.5 F (36.9 C) (07/09 0806) Pulse Rate:  [69-85] 70 (07/09 0806) Resp:  [17-33] 27 (07/09 0806) BP: (130-184)/(52-89) 131/64 mmHg (07/09 0806) SpO2:  [88 %-97 %] 95 % (07/09 0806) Weight:  [126.1 kg (278 lb)] 126.1 kg (278 lb) (07/09 0300)  PHYSICAL EXAMINATION: General:  Well-appearing, no distress Neuro:  Awake, alert, oriented, grossly non-focal HEENT: jvd up Neck:  Supple Cardiovascular:  RRR, no murmurs, no RT heeve Lungs:  Reduced, cta Abdomen: soft, NTND Skin:  Warm, dry   Recent Labs Lab 02/24/14 1706 02/25/14 0120 02/26/14 0222  NA 137 134* 141  K 3.9 3.9 4.1  CL 96 97 103  CO2 22 21 24   BUN 7 7 9   CREATININE 1.09 1.02 1.23  GLUCOSE 101* 117* 121*    Recent Labs Lab 02/25/14 0120 02/25/14 0845 02/26/14 0222  HGB 12.0* 11.7* 11.2*  HCT 35.5* 35.5* 34.3*  WBC 14.2* 13.4* 11.8*    PLT 347 329 332   Dg Chest 2 View  02/24/2014   CLINICAL DATA:  Chest and back pain.  EXAM: CHEST  2 VIEW  COMPARISON:  None.  FINDINGS: The cardiac silhouette, mediastinal and hilar contours are within normal limits. There is tortuosity and calcification of the thoracic aorta. Bibasilar subsegmental atelectasis versus scarring change. No focal airspace consolidation, pulmonary edema or pleural effusion. The bony thorax is intact.  IMPRESSION: Bibasilar atelectasis or scarring changes.   Electronically Signed   By: Loralie Champagne M.D.   On: 02/24/2014 17:46   Ct Angio Chest W/cm &/or Wo Cm  02/25/2014   CLINICAL DATA:  Chest pain, shortness of breath  EXAM: CT ANGIOGRAPHY CHEST WITH CONTRAST  TECHNIQUE: Multidetector CT imaging of the chest was performed using the standard protocol during bolus administration of intravenous contrast. Multiplanar CT image reconstructions and MIPs were obtained to evaluate the vascular anatomy.  CONTRAST:  OMNIPAQUE IOHEXOL 350 MG/ML SOLN  COMPARISON:  Prior radiograph performed earlier on the same day.  FINDINGS: Subcentimeter hypodense nodule noted within the left lobe of thyroid. The thyroid gland is otherwise unremarkable.  No pathologically enlarged mediastinal, hilar, or axillary lymph nodes are identified.  Intrathoracic aorta is of normal caliber and appearance. Scattered atherosclerotic plaque noted within the aortic arch. Great vessels within normal limits.  Heart size is normal.  No pericardial effusion.  Pulmonary arterial tree is well opacified. Multiple filling defects are  seen involving the segmental arteries of the right lower lobe, consistent with acute pulmonary emboli (series 4, image 52). There are nonocclusive emboli within the right upper lobe segmental branches as well (series 4, image 36). There is mild straightening of the interventricular septum with elevated RV to LV ratio of 1.4, suggesting right heart strain. Main pulmonary arteries also  dilated tip 3.6 cm. No left-sided emboli.  Patchy infiltrate within the right lung base may reflect atelectasis or pulmonary infarct. Small layering right pleural effusion present. The left lung is clear.  Visualized upper abdomen within normal limits.  No acute osseous abnormality. No worrisome lytic or blastic osseous lesions.  Review of the MIP images confirms the above findings.  IMPRESSION: 1. Multiple acute pulmonary emboli involving the right lower and upper lobe segmental branches as above. Straightening of the interventricular septum, main pulmonary artery dilatation, and elevated RV to LV ratio suggests secondary right heart strain. 2. Patchy infiltrate within the right lung base, which may reflect atelectasis and/or pulmonary infarct. 3. Small layering right pleural effusion. Critical Value/emergent results were called by telephone at the time of interpretation on 02/24/2014 at 11:58 PM to Dr. Francee PiccoloJENNIFER PIEPENBRINK , who verbally acknowledged these results.   Electronically Signed   By: Rise MuBenjamin  McClintock M.D.   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 Tyson AliasFeinstein, MD, FACP Pgr: [phone] [facility]

## 2014-02-26 NOTE — Progress Notes (Signed)
ANTICOAGULATION CONSULT NOTE - Follow Up Consult  Pharmacy Consult for Heparin and Coumadin Indication: pulmonary embolus and DVT  No Known Allergies  Patient Measurements: Height: 6\' 4"  (193 cm) Weight: 278 lb (126.1 kg) IBW/kg (Calculated) : 86.8 Heparin Dosing Weight: 113 kg  Vital Signs: Temp: 99.3 F (37.4 C) (07/09 1150) Temp src: Oral (07/09 1150) BP: 135/54 mmHg (07/09 1150) Pulse Rate: 74 (07/09 1150)  Labs:  Recent Labs  02/24/14 1706 02/25/14 0120 02/25/14 0712 02/25/14 0845 02/25/14 1255  02/25/14 2355 02/26/14 0222 02/26/14 1045  HGB 13.1 12.0*  --  11.7*  --   --   --  11.2*  --   HCT 39.0 35.5*  --  35.5*  --   --   --  34.3*  --   PLT 370 347  --  329  --   --   --  332  --   HEPARINUNFRC  --   --   --  0.21*  --   < > 0.29* 0.30 0.43  CREATININE 1.09 1.02  --   --   --   --   --  1.23  --   TROPONINI  --  <0.30 <0.30  --  <0.30  --   --   --   --   < > = values in this interval not displayed.  Estimated Creatinine Clearance: 91.4 ml/min (by C-G formula based on Cr of 1.23).  Assessment:   Heparin level is now therapeutic (0.43) on 2000 units/hr.  Coumadin to begin today.  Day #1 of 5 minimum overlap.  Goal of Therapy:  INR 2-3 Heparin level 0.3-0.7 units/ml Monitor platelets by anticoagulation protocol: Yes   Plan:   Continue heparin drip at 2000 units/hr.  Coumadin 10 mg x 1 today.  Daily heparin level, PT/INR and CBC.  Continue heparin/coumadin overlap for at least 5 days, and until INR >2 for 2 consecutive days.   Coumadin education prior to discharge; book ordered, to watch video.  Dennie Fettersgan, Lenzy Kerschner Donovan, [place] Pager: [phone] 02/26/2014,12:54 PM

## 2014-02-26 NOTE — Care Management Note (Signed)
    Page 1 of 2   03/03/2014     2:58:49 PM CARE MANAGEMENT NOTE 03/03/2014  Patient:  Jeffery Gamble,Jeffery Gamble   Account Number:  [id number]  Date Initiated:  02/25/2014  Documentation initiated by:  Avie ArenasBROWN,SARAH  Subjective/Objective Assessment:   PE with R heart strain.     Action/Plan:   Anticipated DC Date:  02/27/2014   Anticipated DC Plan:  HOME/SELF CARE      DC Planning Services  CM consult  [facility]  Follow-up appt scheduled      Choice offered to / List presented to:             Status of service:  Completed, signed off Medicare Important Message given?   (If response is "NO", the following Medicare IM given date fields will be blank) Date Medicare IM given:   Medicare IM given by:   Date Additional Medicare IM given:   Additional Medicare IM given by:    Discharge Disposition:  HOME/SELF CARE  Per UR Regulation:  Reviewed for med. necessity/level of care/duration of stay  If discussed at Long Length of Stay Meetings, dates discussed:    Comments:  Contact:  Cobbs,Angelina Spouse     [phone]  03/03/14 Sidney AceJulie Lua Feng, RN, BSN [phone] Pt for dc home today.  [facility] states pt can walk in any day this week between 9-10.  Appt made for pt for July 17 at 9:00; pt will need PT/INR check this day as well.  Stressed to pt importance of taking dc instructions sheet and notifying clinic of need for PT/INR on 7/17.  He states he will do this.  02-26-14 3:22pm Avie ArenasSarah Brown, RNBSNB [phone] Talked with patient and wife.  They are able to afford 4-10 [facility]  [facility] and states to send over on discharge for meds - can also fill 30 day free sample card for whatever med he may be on and can set up  his appt for followup at that time.  Patient and wife state they plan to go there on discharge.  NOTE - [facility] is not [facility] on w[facility]e.   At this time looks like will dc on coumadin -  can also get on 4 dollar list at [facility].  Can still use [facility] for f/u and INR checks.  Awaiting for appt call back.  Advised pateint to go over to make appt if no call back.   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  Suggest [facility] for meds, PT,INR's and follow up.

## 2014-02-27 ENCOUNTER — Inpatient Hospital Stay (HOSPITAL_COMMUNITY): Payer: Self-pay

## 2014-02-27 LAB — BASIC METABOLIC PANEL
ANION GAP: 16 — AB (ref 5–15)
BUN: 9 mg/dL (ref 6–23)
CHLORIDE: 99 meq/L (ref 96–112)
CO2: 22 mEq/L (ref 19–32)
Calcium: 8.7 mg/dL (ref 8.4–10.5)
Creatinine, Ser: 1.04 mg/dL (ref 0.50–1.35)
GFR, EST AFRICAN AMERICAN: 88 mL/min — AB (ref >90)
GFR, EST NON AFRICAN AMERICAN: 76 mL/min — AB (ref >90)
Glucose, Bld: 86 mg/dL (ref 70–99)
POTASSIUM: 3.9 meq/L (ref 3.7–5.3)
Sodium: 137 mEq/L (ref 137–147)

## 2014-02-27 LAB — CBC
HCT: 35 % — ABNORMAL LOW (ref 39.0–52.0)
Hemoglobin: 11.2 g/dL — ABNORMAL LOW (ref 13.0–17.0)
MCH: 29 pg (ref 26.0–34.0)
MCHC: 32 g/dL (ref 30.0–36.0)
MCV: 90.7 fL (ref 78.0–100.0)
PLATELETS: 348 10*3/uL (ref 150–400)
RBC: 3.86 MIL/uL — AB (ref 4.22–5.81)
RDW: 15.5 % (ref 11.5–15.5)
WBC: 9.9 10*3/uL (ref 4.0–10.5)

## 2014-02-27 LAB — HEPARIN LEVEL (UNFRACTIONATED)
Heparin Unfractionated: 0.26 IU/mL — ABNORMAL LOW (ref 0.30–0.70)
Heparin Unfractionated: 0.57 IU/mL (ref 0.30–0.70)

## 2014-02-27 LAB — FACTOR 5 LEIDEN

## 2014-02-27 LAB — PROTIME-INR
INR: 1.08 (ref 0.00–1.49)
Prothrombin Time: 14 seconds (ref 11.6–15.2)

## 2014-02-27 MED ORDER — WARFARIN SODIUM 2.5 MG PO TABS
12.5000 mg | ORAL_TABLET | Freq: Once | ORAL | Status: AC
  Administered 2014-02-27: 12.5 mg via ORAL
  Filled 2014-02-27: qty 1

## 2014-02-27 MED ORDER — HEPARIN BOLUS VIA INFUSION
2000.0000 [IU] | Freq: Once | INTRAVENOUS | Status: AC
  Administered 2014-02-27: 2000 [IU] via INTRAVENOUS
  Filled 2014-02-27: qty 2000

## 2014-02-27 NOTE — Progress Notes (Signed)
ANTICOAGULATION CONSULT NOTE - Follow Up Consult  Pharmacy Consult for heparin Indication: DVT and PE  No Known Allergies  Patient Measurements: Height: 6\' 4"  (193 cm) Weight: 281 lb 4.8 oz (127.597 kg) IBW/kg (Calculated) : 86.8 Heparin Dosing Weight: 113 kg  Vital Signs: Temp: 98.5 F (36.9 C) (07/10 1410) Temp src: Oral (07/10 1410) BP: 133/70 mmHg (07/10 1410) Pulse Rate: 77 (07/10 1410)  Labs:  Recent Labs  02/25/14 0120 02/25/14 0712 02/25/14 0845 02/25/14 1255  02/26/14 0222 02/26/14 1045 02/27/14 0600  HGB 12.0*  --  11.7*  --   --  11.2*  --  11.2*  HCT 35.5*  --  35.5*  --   --  34.3*  --  35.0*  PLT 347  --  329  --   --  332  --  348  LABPROT  --   --   --   --   --   --   --  14.0  INR  --   --   --   --   --   --   --  1.08  HEPARINUNFRC  --   --  0.21*  --   < > 0.30 0.43 0.26*  CREATININE 1.02  --   --   --   --  1.23  --  1.04  TROPONINI <0.30 <0.30  --  <0.30  --   --   --   --   < > = values in this interval not displayed.  Estimated Creatinine Clearance: 108.8 ml/min (by C-G formula based on Cr of 1.04).   Medications:  Scheduled:  . sodium chloride  3 mL Intravenous Q12H  . warfarin  12.5 mg Oral ONCE-1800  . Warfarin - Pharmacist Dosing Inpatient   Does not apply q1800   Infusions:  . heparin 2,150 Units/hr (02/27/14 1415)    Assessment: 63 yo male with PE/DVT is currently on therapeutic heparin.  Heparin level is 0.57.  Goal of Therapy:  Heparin level 0.3-0.7 units/ml Monitor platelets by anticoagulation protocol: Yes   Plan:  1) Continue heparin at 2150 units/hr 2) Heparin level and CBC in am   Kruze Atchley, Tsz-Yin 02/27/2014,3:27 PM

## 2014-02-27 NOTE — Progress Notes (Signed)
[facility] PROGRESS NOTE  Jeffery Gamble YPP:[id number] DOB: Nov 01, 1950 DOA: 02/24/2014 PCP: No primary provider on file.  Assessment/Plan: 1-Multiple acute Pulmonary embolism: Right side heart strain on CT angio. ECHO with possible right heart strain finding.  Doppler; preliminary report with DVT in left common femoral, profunda and popliteal and posterior tibial veins.  -Continue with heparin Gtt. Pulmonary following. Started on coumadin. Day 2 overlap.  -ANA negative. , Homocysteine: 12, factor 5 leyden not detected. ESR at 75. PSA at 17.  -Pain better on percocet.   2-Leukocytosis: UA negative, Blood culture no growth to date.  Urine culture no growth. WBC trending down.   3-Mild fever; could be related to PE. Afebrile.   4-Increase PSA; He will need further evaluation and urology evaluation.   Code Status: Full Code.  Family Communication: care discussed with patient.  Disposition Plan: home when INR at goal.    Consultants:  CCM  Procedures: ECHO: - LVEF 60-65%, mildly dilated RV with indeterminate function, mild TR, RVSP 48 mmHg, moderately dilated RA. findings could be consistent with right heart strain in the setting of pulmonary embolus. No prior echo for comparison.  Doppler: Bilateral lower extremity venous duplex completed. Right: No evidence of DVT, superficial thrombosis, or Baker's cyst. Left: DVT noted in the common femoral, profunda, femoral, popliteal, and posterior tibial veins. No evidence of superficial thrombosis. No Baker's cyst.    Antibiotics:  none  HPI/Subjective: Still with chest pain but better controled on increase dose of percocet.  Denies worsening SOB.   Objective: Filed Vitals:   02/27/14 1410  BP: 133/70  Pulse: 77  Temp: 98.5 F (36.9 C)  Resp: 18    Intake/Output Summary (Last 24 hours) at 02/27/14 1803 Last data filed at 02/27/14 1411  Gross per 24 hour  Intake    655 ml  Output    800 ml  Net   -145 ml   Filed  Weights   02/25/14 0507 02/26/14 0300 02/27/14 0612  Weight: 124.6 kg (274 lb 11.1 oz) 126.1 kg (278 lb) 127.597 kg (281 lb 4.8 oz)    Exam:   General:  No acute distress.   Cardiovascular: S 1, S 2 RRR  Respiratory: CTA  Abdomen: Bs present, soft, NT  Musculoskeletal: left LE edema.   Data Reviewed: Basic Metabolic Panel:  Recent Labs Lab 02/24/14 1706 02/25/14 0120 02/26/14 0222 02/27/14 0600  NA 137 134* 141 137  K 3.9 3.9 4.1 3.9  CL 96 97 103 99  CO2 '22 21 24 22  ' GLUCOSE 101* 117* 121* 86  BUN '7 7 9 9  ' CREATININE 1.09 1.02 1.23 1.04  CALCIUM 9.2 8.7 8.3* 8.7   Liver Function Tests:  Recent Labs Lab 02/25/14 0120 02/26/14 0222  AST 14 16  ALT 12 14  ALKPHOS 81 78  BILITOT 0.6 0.2*  PROT 7.2 7.0  ALBUMIN 2.8* 2.6*   No results found for this basename: LIPASE, AMYLASE,  in the last 168 hours No results found for this basename: AMMONIA,  in the last 168 hours CBC:  Recent Labs Lab 02/24/14 1706 02/25/14 0120 02/25/14 0845 02/26/14 0222 02/27/14 0600  WBC 13.5* 14.2* 13.4* 11.8* 9.9  NEUTROABS  --  10.2*  --   --   --   HGB 13.1 12.0* 11.7* 11.2* 11.2*  HCT 39.0 35.5* 35.5* 34.3* 35.0*  MCV 88.4 87.9 88.5 89.3 90.7  PLT 370 347 329 332 348   Cardiac Enzymes:  Recent Labs Lab 02/25/14 0120 02/25/14  2119 02/25/14 1255  TROPONINI <0.30 <0.30 <0.30   BNP (last 3 results)  Recent Labs  02/25/14 0120  PROBNP 30.8   CBG:  Recent Labs Lab 02/26/14 1201  GLUCAP 98    Recent Results (from the past 240 hour(s))  URINE CULTURE     Status: None   Collection Time    02/24/14  9:55 PM      Result Value Ref Range Status   Specimen Description URINE, CLEAN CATCH   Final   Special Requests NONE   Final   Culture  Setup Time     Final   Value: 02/24/2014 23:30     Performed at [facility] Count     Final   Value: NO GROWTH     Performed at [facility]   Culture     Final   Value: NO GROWTH     Performed  at [facility]   Report Status 02/26/2014 FINAL   Final  CULTURE, BLOOD (ROUTINE X 2)     Status: None   Collection Time    02/25/14  2:30 AM      Result Value Ref Range Status   Specimen Description BLOOD LEFT ARM   Final   Special Requests BOTTLES DRAWN AEROBIC AND ANAEROBIC 10CC EACH   Final   Culture  Setup Time     Final   Value: 02/25/2014 08:38     Performed at [facility]   Culture     Final   Value:        BLOOD CULTURE RECEIVED NO GROWTH TO DATE CULTURE WILL BE HELD FOR 5 DAYS BEFORE ISSUING A FINAL NEGATIVE REPORT     Performed at [facility]   Report Status PENDING   Incomplete  CULTURE, BLOOD (ROUTINE X 2)     Status: None   Collection Time    02/25/14  2:38 AM      Result Value Ref Range Status   Specimen Description BLOOD BLOOD LEFT FOREARM   Final   Special Requests BOTTLES DRAWN AEROBIC AND ANAEROBIC 10CC EACH   Final   Culture  Setup Time     Final   Value: 02/25/2014 08:38     Performed at [facility]   Culture     Final   Value:        BLOOD CULTURE RECEIVED NO GROWTH TO DATE CULTURE WILL BE HELD FOR 5 DAYS BEFORE ISSUING A FINAL NEGATIVE REPORT     Performed at [facility]   Report Status PENDING   Incomplete  MRSA PCR SCREENING     Status: None   Collection Time    02/25/14  4:56 AM      Result Value Ref Range Status   MRSA by PCR NEGATIVE  NEGATIVE Final   Comment:            The GeneXpert MRSA Assay (FDA     approved for NASAL specimens     only), is one component of a     comprehensive MRSA colonization     surveillance program. It is not     intended to diagnose MRSA     infection nor to guide or     monitor treatment for     MRSA infections.     Studies: Dg Chest Port 1 View  02/27/2014   CLINICAL DATA:  Assess infarct.  EXAM: PORTABLE CHEST - 1 VIEW  COMPARISON:  02/24/2014  FINDINGS: Airspace opacity increased in the right lower lobe. This could represent atelectasis or infarct related to  previously diagnosed pulmonary emboli. Minimal left base atelectasis. Heart is borderline in size. Small right pleural effusion.  No acute bony abnormality.  IMPRESSION: Increasing right basilar atelectasis or pulmonary infarct.  Small right pleural effusion.   Electronically Signed   By: Rolm Baptise M.D.   On: 02/27/2014 08:00    Scheduled Meds: . sodium chloride  3 mL Intravenous Q12H  . Warfarin - Pharmacist Dosing Inpatient   Does not apply q1800   Continuous Infusions: . heparin 2,150 Units/hr (02/27/14 1415)    Active Problems:   PE (pulmonary embolism)   Leukocytosis, unspecified   Fever, unspecified   DVT, femoral, acute    Time spent: 25 minutes.     Niel Hummer A  [facility] Pager [phone]. If 7PM-7AM, please contact night-coverage at [email or web address], password [facility] 02/27/2014, 6:03 PM  LOS: 3 days

## 2014-02-27 NOTE — Progress Notes (Addendum)
ANTICOAGULATION CONSULT NOTE - Follow Up Consult  Pharmacy Consult for Heparin and warfarin Indication: pulmonary embolus and DVT  No Known Allergies  Patient Measurements: Height: 6\' 4"  (193 cm) Weight: 281 lb 4.8 oz (127.597 kg) IBW/kg (Calculated) : 86.8 Heparin Dosing Weight: 113 kg  Vital Signs: Temp: 98.2 F (36.8 C) (07/10 0612) Temp src: Oral (07/10 0612) BP: 126/85 mmHg (07/10 0612) Pulse Rate: 69 (07/10 0612)  Labs:  Recent Labs  02/25/14 0120 02/25/14 0712 02/25/14 0845 02/25/14 1255  02/26/14 0222 02/26/14 1045 02/27/14 0600  HGB 12.0*  --  11.7*  --   --  11.2*  --  11.2*  HCT 35.5*  --  35.5*  --   --  34.3*  --  35.0*  PLT 347  --  329  --   --  332  --  348  LABPROT  --   --   --   --   --   --   --  14.0  INR  --   --   --   --   --   --   --  1.08  HEPARINUNFRC  --   --  0.21*  --   < > 0.30 0.43 0.26*  CREATININE 1.02  --   --   --   --  1.23  --  1.04  TROPONINI <0.30 <0.30  --  <0.30  --   --   --   --   < > = values in this interval not displayed.  Estimated Creatinine Clearance: 108.8 ml/min (by C-G formula based on Cr of 1.04).  Assessment: [age] YOM with extensive PE and DVT on D#2 out of minimum of 5 of overlap therapy with heparin and warfarin. Heparin level dropped this morning to 0.26units/mL. INR after one dose of 10mg  of warfarin is 1.08. RN confirmed no issues with heparin line. Hgb is low but stable, plts WNL. No bleeding noted.  Goal of Therapy:  INR 2-3 Heparin level 0.3-0.7 units/ml Monitor platelets by anticoagulation protocol: Yes   Plan:  1. Heparin bolus with 2000 units, then increase drip to 2150 units/hr 2. Warfarin 12.5mg  po x1 tonight  3. Heparin level in 6 hours 4. Daily heparin level, PT/INR and CBC. 5. Follow for s/s bleeding 6. Will provide education  Desta Bujak D. Leaha Cuervo, PharmD, BCPS Clinical Pharmacist Pager: [phone] 02/27/2014 9:21 AM

## 2014-02-28 LAB — CBC
HCT: 32.9 % — ABNORMAL LOW (ref 39.0–52.0)
HEMOGLOBIN: 10.7 g/dL — AB (ref 13.0–17.0)
MCH: 29 pg (ref 26.0–34.0)
MCHC: 32.5 g/dL (ref 30.0–36.0)
MCV: 89.2 fL (ref 78.0–100.0)
Platelets: 397 10*3/uL (ref 150–400)
RBC: 3.69 MIL/uL — ABNORMAL LOW (ref 4.22–5.81)
RDW: 15.4 % (ref 11.5–15.5)
WBC: 9.8 10*3/uL (ref 4.0–10.5)

## 2014-02-28 LAB — HEPARIN LEVEL (UNFRACTIONATED): Heparin Unfractionated: 0.38 IU/mL (ref 0.30–0.70)

## 2014-02-28 LAB — PROTIME-INR
INR: 1.18 (ref 0.00–1.49)
Prothrombin Time: 15 seconds (ref 11.6–15.2)

## 2014-02-28 MED ORDER — OXYCODONE-ACETAMINOPHEN 5-325 MG PO TABS
1.0000 | ORAL_TABLET | Freq: Four times a day (QID) | ORAL | Status: DC | PRN
  Administered 2014-02-28 – 2014-03-02 (×7): 2 via ORAL
  Administered 2014-03-02: 1 via ORAL
  Administered 2014-03-02 – 2014-03-03 (×4): 2 via ORAL
  Filled 2014-02-28 (×12): qty 2

## 2014-02-28 MED ORDER — WARFARIN SODIUM 7.5 MG PO TABS
15.0000 mg | ORAL_TABLET | Freq: Once | ORAL | Status: AC
  Administered 2014-02-28: 15 mg via ORAL
  Filled 2014-02-28: qty 2

## 2014-02-28 MED ORDER — POLYETHYLENE GLYCOL 3350 17 G PO PACK
17.0000 g | PACK | Freq: Two times a day (BID) | ORAL | Status: DC
  Filled 2014-02-28 (×8): qty 1

## 2014-02-28 MED ORDER — OXYCODONE-ACETAMINOPHEN 5-325 MG PO TABS: 1.0000 | ORAL_TABLET | ORAL | Status: DC | PRN

## 2014-02-28 NOTE — Consult Note (Signed)
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 Chest CTA multiple acute PE RUL / RLL with concern for R-heart strain.  SIGNIFICANT EVENTS / STUDIES:  7/7 - Admit, CP/SOB, elevated D-dimer 3.37       -  Chest CTA >> multiple acute PE RUL / RLL with pulm artery dilatation, concern for R-heart strain, also noted patchy infiltrate R-lung base, possible atelectasis vs pulm infarct 7/8 dopplers>>>Left: DVT noted in the common femoral, profunda, femoral, popliteal, and posterior tibial veins 7/8 echo>>>LVEF 60-65%, mildly dilated RV with indeterminate function, mild TR, RVSP 48 mmHg, moderately dilated RA.   LINES / TUBES: PIVs  CULTURES: Blood x 2, 7/7 >> Urine 7/7 > neg  ANTIBIOTICS: none  SUBJECTIVE:   Still some cp supine and with deep breath, pleuritic quality on R lat/post  VITAL SIGNS: Temp:  [98.5 F (36.9 C)-99.2 F (37.3 C)] 98.6 F (37 C) (07/11 78290633) Pulse Rate:  [65-77] 65 (07/11 0633) Resp:  [18] 18 (07/11 0633) BP: (133-152)/(69-82) 139/69 mmHg (07/11 0633) SpO2:  [90 %-95 %] 95 % (07/11 56210633) Weight:  [282 lb 1.6 oz (127.96 kg)] 282 lb 1.6 oz (127.96 kg) (07/11 0633) FIO2  RA  PHYSICAL EXAMINATION: General:  Well-appearing, no distress Neuro:  Awake, alert, oriented, grossly non-focal HEENT: jvd up Neck:  Supple Cardiovascular:  RRR, no murmurs, no RT heeve Lungs:  Reduced, cta Abdomen: soft, NTND Skin:  Warm, dry   Recent Labs Lab 02/25/14 0120 02/26/14 0222 02/27/14 0600  NA 134* 141 137  K 3.9 4.1 3.9  CL 97 103 99  CO2 21 24 22   BUN 7 9 9   CREATININE 1.02 1.23 1.04  GLUCOSE 117* 121* 86    Recent Labs Lab 02/26/14 0222  02/27/14 0600 02/28/14 0405  HGB 11.2* 11.2* 10.7*  HCT 34.3* 35.0* 32.9*  WBC 11.8* 9.9 9.8  PLT 332 348 397   Dg Chest Port 1 View  02/27/2014   CLINICAL DATA:  Assess infarct.  EXAM: PORTABLE CHEST - 1 VIEW  COMPARISON:  02/24/2014  FINDINGS: Airspace opacity increased in the right lower lobe. This could represent atelectasis or infarct related to previously diagnosed pulmonary emboli. Minimal left base atelectasis. Heart is borderline in size. Small right pleural effusion.  No acute bony abnormality.  IMPRESSION: Increasing right basilar atelectasis or pulmonary infarct.  Small right pleural effusion.   Electronically Signed   By: Charlett NoseKevin  Dover M.D.   On: 02/27/2014 08:00    ASSESSMENT / PLAN:  PULMONARY / VASCULAR A:  Acute Multiple PE (Right sided),CT ratio NOT c/w Echo, also trop neg DVT Extensive (non mobile) Pulm infarct on R likely  [employer] architectTruck Driver - mobility issue PSa 17  P: -will need urology as outpt -homocysteine some risk,but not needed to treat -heparin drip to continue> coumadin per pharmacy - repeat pa and last cxr prior to discharge for baseline but the acute changes are likley all infarction related and should improve with time   No other pulmonary issues as inpt > let us know if you need us to f/u as outpt   Sandrea HughsMichael Mansi Tokar, MD Pulmonary and Critical Care Medicine [facility] Cell [phone] After 5:30 PM or weekends, call  [phone]          

## 2014-02-28 NOTE — Progress Notes (Signed)
ANTICOAGULATION CONSULT NOTE - Follow Up Consult  Pharmacy Consult for Heparin and warfarin Indication: pulmonary embolus and DVT  No Known Allergies   Patient Measurements: Height: 6\' 4"  (193 cm) Weight: 282 lb 1.6 oz (127.96 kg) IBW/kg (Calculated) : 86.8 Heparin Dosing Weight: 113 kg  Vital Signs: Temp: 98.6 F (37 C) (07/11 0633) Temp src: Oral (07/11 0633) BP: 139/69 mmHg (07/11 0633) Pulse Rate: 65 (07/11 0633)  Labs:  Recent Labs  02/25/14 1255  02/26/14 0222  02/27/14 0600 02/27/14 1620 02/28/14 0405  HGB  --   < > 11.2*  --  11.2*  --  10.7*  HCT  --   --  34.3*  --  35.0*  --  32.9*  PLT  --   --  332  --  348  --  397  LABPROT  --   --   --   --  14.0  --  15.0  INR  --   --   --   --  1.08  --  1.18  HEPARINUNFRC  --   < > 0.30  < > 0.26* 0.57 0.38  CREATININE  --   --  1.23  --  1.04  --   --   TROPONINI <0.30  --   --   --   --   --   --   < > = values in this interval not displayed.  Estimated Creatinine Clearance: 109 ml/min (by C-G formula based on Cr of 1.04).  Assessment: [age] YOM with extensive PE and DVT on D#3 out of minimum of 5 of overlap therapy with heparin and warfarin. Heparin level this morning remains therapeutic at 0.38 units/mL on 2150 units/hr. INR 1.18. Hgb dropped slightly and remains low, plts WNL. No bleeding noted.  Goal of Therapy:  INR 2-3 Heparin level 0.3-0.7 units/ml Monitor platelets by anticoagulation protocol: Yes   Plan:  1. Heparin drip at 2150 units/hr 2. Warfarin 15mg  po x1 tonight  3. Daily heparin level, PT/INR and CBC. 4. Follow for s/s bleeding 5. Will provide education  Naylah Cork D. Wille Aubuchon, PharmD, BCPS Clinical Pharmacist Pager: [phone] 02/28/2014 10:37 AM

## 2014-02-28 NOTE — Progress Notes (Signed)
[facility] PROGRESS NOTE  Jeffery Gamble VEH:[id number] DOB: [birth year] DOA: 02/24/2014 PCP: No primary provider on file.  Assessment/Plan: 1-Multiple acute Pulmonary embolism: Pulmonary infarct.  -Right side heart strain on CT angio. ECHO with possible right heart strain finding.  Doppler; preliminary report with DVT in left common femoral, profunda and popliteal and posterior tibial veins.  -Continue with heparin Gtt. Pulmonary following. Started on coumadin. Day 3 overlap. INR at 1.1. -ANA negative. , Homocysteine: 12, factor 5 leyden not detected. ESR at 75. PSA at 17.  -Pain better on percocet.  -Need chest x ray prior to discharge.   2-Leukocytosis: UA negative, Blood culture no growth to date.  Urine culture no growth. WBC trending down.   3-Mild fever; could be related to PE. Afebrile.   4-Increase PSA; He will need to follow up with  Urology.   Code Status: Full Code.  Family Communication: care discussed with patient.  Disposition Plan: home when INR at goal.    Consultants:  CCM  Procedures: ECHO: - LVEF 60-65%, mildly dilated RV with indeterminate function, mild TR, RVSP 48 mmHg, moderately dilated RA. findings could be consistent with right heart strain in the setting of pulmonary embolus. No prior echo for comparison.  Doppler: Bilateral lower extremity venous duplex completed. Right: No evidence of DVT, superficial thrombosis, or Baker's cyst. Left: DVT noted in the common femoral, profunda, femoral, popliteal, and posterior tibial veins. No evidence of superficial thrombosis. No Baker's cyst.    Antibiotics:  none  HPI/Subjective: Chest pain is not worse, pain medication helps.   Objective: Filed Vitals:   02/28/14 0633  BP: 139/69  Pulse: 65  Temp: 98.6 F (37 C)  Resp: 18    Intake/Output Summary (Last 24 hours) at 02/28/14 1330 Last data filed at 02/28/14 0847  Gross per 24 hour  Intake    600 ml  Output    725 ml  Net   -125 ml    Filed Weights   02/26/14 0300 02/27/14 0612 02/28/14 0633  Weight: 126.1 kg (278 lb) 127.597 kg (281 lb 4.8 oz) 127.96 kg (282 lb 1.6 oz)    Exam:   General:  No acute distress.   Cardiovascular: S 1, S 2 RRR  Respiratory: CTA  Abdomen: Bs present, soft, NT  Musculoskeletal: left LE edema.   Data Reviewed: Basic Metabolic Panel:  Recent Labs Lab 02/24/14 1706 02/25/14 0120 02/26/14 0222 02/27/14 0600  NA 137 134* 141 137  K 3.9 3.9 4.1 3.9  CL 96 97 103 99  CO2 _0 GLUCOSE 101* 117* 121* 86  BUN _1 CREATININE 1.09 1.02 1.23 1.04  CALCIUM 9.2 8.7 8.3* 8.7   Liver Function Tests:  Recent Labs Lab 02/25/14 0120 02/26/14 0222  AST 14 16  ALT 12 14  ALKPHOS 81 78  BILITOT 0.6 0.2*  PROT 7.2 7.0  ALBUMIN 2.8* 2.6*   No results found for this basename: LIPASE, AMYLASE,  in the last 168 hours No results found for this basename: AMMONIA,  in the last 168 hours CBC:  Recent Labs Lab 02/24/14 1706 02/25/14 0120 02/25/14 0845 02/26/14 0222 02/27/14 0600 02/28/14 0405  WBC 13.5* 14.2* 13.4* 11.8* 9.9 9.8  NEUTROABS  --  10.2*  --   --   --   --   HGB 13.1 12.0* 11.7* 11.2* 11.2* 10.7*  HCT 39.0 35.5* 35.5* 34.3* 35.0* 32.9*  MCV 88.4 87.9 88.5 89.3 90.7 89.2  PLT 370  347 329 332 348 397   Cardiac Enzymes:  Recent Labs Lab 02/25/14 0120 02/25/14 0712 02/25/14 1255  TROPONINI <0.30 <0.30 <0.30   BNP (last 3 results)  Recent Labs  02/25/14 0120  PROBNP 30.8   CBG:  Recent Labs Lab 02/26/14 1201  GLUCAP 98    Recent Results (from the past 240 hour(s))  URINE CULTURE     Status: None   Collection Time    02/24/14  9:55 PM      Result Value Ref Range Status   Specimen Description URINE, CLEAN CATCH   Final   Special Requests NONE   Final   Culture  Setup Time     Final   Value: 02/24/2014 23:30     Performed at [facility]     Final   Value: NO GROWTH     Performed at [facility]    Culture     Final   Value: NO GROWTH     Performed at [facility]   Report Status 02/26/2014 FINAL   Final  CULTURE, BLOOD (ROUTINE X 2)     Status: None   Collection Time    02/25/14  2:30 AM      Result Value Ref Range Status   Specimen Description BLOOD LEFT ARM   Final   Special Requests BOTTLES DRAWN AEROBIC AND ANAEROBIC 10CC EACH   Final   Culture  Setup Time     Final   Value: 02/25/2014 08:38     Performed at [facility]   Culture     Final   Value:        BLOOD CULTURE RECEIVED NO GROWTH TO DATE CULTURE WILL BE HELD FOR 5 DAYS BEFORE ISSUING A FINAL NEGATIVE REPORT     Performed at [facility]   Report Status PENDING   Incomplete  CULTURE, BLOOD (ROUTINE X 2)     Status: None   Collection Time    02/25/14  2:38 AM      Result Value Ref Range Status   Specimen Description BLOOD BLOOD LEFT FOREARM   Final   Special Requests BOTTLES DRAWN AEROBIC AND ANAEROBIC 10CC EACH   Final   Culture  Setup Time     Final   Value: 02/25/2014 08:38     Performed at [facility]   Culture     Final   Value:        BLOOD CULTURE RECEIVED NO GROWTH TO DATE CULTURE WILL BE HELD FOR 5 DAYS BEFORE ISSUING A FINAL NEGATIVE REPORT     Performed at [facility]   Report Status PENDING   Incomplete  MRSA PCR SCREENING     Status: None   Collection Time    02/25/14  4:56 AM      Result Value Ref Range Status   MRSA by PCR NEGATIVE  NEGATIVE Final   Comment:            The GeneXpert MRSA Assay (FDA     approved for NASAL specimens     only), is one component of a     comprehensive MRSA colonization     surveillance program. It is not     intended to diagnose MRSA     infection nor to guide or     monitor treatment for     MRSA infections.     Studies: Dg Chest Port 1 View  02/27/2014  CLINICAL DATA:  Assess infarct.  EXAM: PORTABLE CHEST - 1 VIEW  COMPARISON:  02/24/2014  FINDINGS: Airspace opacity increased in the right lower lobe. This  could represent atelectasis or infarct related to previously diagnosed pulmonary emboli. Minimal left base atelectasis. Heart is borderline in size. Small right pleural effusion.  No acute bony abnormality.  IMPRESSION: Increasing right basilar atelectasis or pulmonary infarct.  Small right pleural effusion.   Electronically Signed   By: Rolm Baptise M.D.   On: 02/27/2014 08:00    Scheduled Meds: . sodium chloride  3 mL Intravenous Q12H  . warfarin  15 mg Oral ONCE-1800  . Warfarin - Pharmacist Dosing Inpatient   Does not apply q1800   Continuous Infusions: . heparin 2,150 Units/hr (02/28/14 0219)    Active Problems:   PE (pulmonary embolism)   Leukocytosis, unspecified   Fever, unspecified   DVT, femoral, acute    Time spent: 25 minutes.     Niel Hummer A  [facility] Pager [phone]. If 7PM-7AM, please contact night-coverage at [email or web address], password [facility] 02/28/2014, 1:30 PM  LOS: 4 days

## 2014-03-01 LAB — CBC
HCT: 34.9 % — ABNORMAL LOW (ref 39.0–52.0)
Hemoglobin: 11.4 g/dL — ABNORMAL LOW (ref 13.0–17.0)
MCH: 29 pg (ref 26.0–34.0)
MCHC: 32.7 g/dL (ref 30.0–36.0)
MCV: 88.8 fL (ref 78.0–100.0)
PLATELETS: 439 10*3/uL — AB (ref 150–400)
RBC: 3.93 MIL/uL — ABNORMAL LOW (ref 4.22–5.81)
RDW: 15.4 % (ref 11.5–15.5)
WBC: 8.9 10*3/uL (ref 4.0–10.5)

## 2014-03-01 LAB — PROTIME-INR
INR: 1.53 — ABNORMAL HIGH (ref 0.00–1.49)
Prothrombin Time: 18.4 seconds — ABNORMAL HIGH (ref 11.6–15.2)

## 2014-03-01 LAB — HEPARIN LEVEL (UNFRACTIONATED): HEPARIN UNFRACTIONATED: 0.54 [IU]/mL (ref 0.30–0.70)

## 2014-03-01 MED ORDER — WARFARIN SODIUM 2.5 MG PO TABS
12.5000 mg | ORAL_TABLET | Freq: Once | ORAL | Status: AC
  Administered 2014-03-01: 12.5 mg via ORAL
  Filled 2014-03-01: qty 1

## 2014-03-01 NOTE — Discharge Instructions (Addendum)
Information on my medicine - Coumadin®   (Warfarin) ° °This medication education was reviewed with me or my healthcare representative as part of my discharge preparation.  The pharmacist that spoke with me during my hospital stay was:  Bajbus, Lauren, RPH ° °Why was Coumadin prescribed for you? °Coumadin was prescribed for you because you have a blood clot or a medical condition that can cause an increased risk of forming blood clots. Blood clots can cause serious health problems by blocking the flow of blood to the heart, lung, or brain. Coumadin can prevent harmful blood clots from forming. °As a reminder your indication for Coumadin is:   Pulmonary Embolism Treatment ° °What test will check on my response to Coumadin? °While on Coumadin (warfarin) you will need to have an INR test regularly to ensure that your dose is keeping you in the desired range. The INR (international normalized ratio) number is calculated from the result of the laboratory test called prothrombin time (PT). ° °If an INR APPOINTMENT HAS NOT ALREADY BEEN MADE FOR YOU please schedule an appointment to have this lab work done by your health care provider within 7 days. °Your INR goal is usually a number between:  2 to 3 or your provider may give you a more narrow range like 2-2.5.  Ask your health care provider during an office visit what your goal INR is. ° °What  do you need to  know  About  COUMADIN? °Take Coumadin (warfarin) exactly as prescribed by your healthcare provider about the same time each day.  DO NOT stop taking without talking to the doctor who prescribed the medication.  Stopping without other blood clot prevention medication to take the place of Coumadin may increase your risk of developing a new clot or stroke.  Get refills before you run out. ° °What do you do if you miss a dose? °If you miss a dose, take it as soon as you remember on the same day then continue your regularly scheduled regimen the next day.  Do not take two  doses of Coumadin at the same time. ° °Important Safety Information °A possible side effect of Coumadin (Warfarin) is an increased risk of bleeding. You should call your healthcare provider right away if you experience any of the following: °? Bleeding from an injury or your nose that does not stop. °? Unusual colored urine (red or dark brown) or unusual colored stools (red or black). °? Unusual bruising for unknown reasons. °? A serious fall or if you hit your head (even if there is no bleeding). ° °Some foods or medicines interact with Coumadin® (warfarin) and might alter your response to warfarin. To help avoid this: °? Eat a balanced diet, maintaining a consistent amount of Vitamin K. °? Notify your provider about major diet changes you plan to make. °? Avoid alcohol or limit your intake to 1 drink for women and 2 drinks for men per day. °(1 drink is 5 oz. wine, 12 oz. beer, or 1.5 oz. liquor.) ° °Make sure that ANY health care provider who prescribes medication for you knows that you are taking Coumadin (warfarin).  Also make sure the healthcare provider who is monitoring your Coumadin knows when you have started a new medication including herbals and non-prescription products. ° °Coumadin® (Warfarin)  Major Drug Interactions  °Increased Warfarin Effect Decreased Warfarin Effect  °Alcohol (large quantities) °Antibiotics (esp. Septra/Bactrim, Flagyl, Cipro) °Amiodarone (Cordarone) °Aspirin (ASA) °Cimetidine (Tagamet) °Megestrol (Megace) °NSAIDs (ibuprofen, naproxen, etc.) °Piroxicam (Feldene) °  Megace) NSAIDs (ibuprofen, naproxen, etc.) Piroxicam (Feldene) Propafenone (Rythmol SR) Propranolol (Inderal) Isoniazid (INH) Posaconazole (Noxafil) Barbiturates (Phenobarbital) Carbamazepine (Tegretol) Chlordiazepoxide (Librium) Cholestyramine (Questran) Griseofulvin Oral Contraceptives Rifampin Sucralfate (Carafate) Vitamin K   Coumadin (Warfarin) Major Herbal Interactions  Increased Warfarin Effect Decreased Warfarin Effect  Garlic Ginseng Ginkgo biloba Coenzyme  Q10 Green tea St. Johns wort    Coumadin (Warfarin) FOOD Interactions  Eat a consistent number of servings per week of foods HIGH in Vitamin K (1 serving =  cup)  Collards (cooked, or boiled & drained) Kale (cooked, or boiled & drained) Mustard greens (cooked, or boiled & drained) Parsley *serving size only =  cup Spinach (cooked, or boiled & drained) Swiss chard (cooked, or boiled & drained) Turnip greens (cooked, or boiled & drained)  Eat a consistent number of servings per week of foods MEDIUM-HIGH in Vitamin K (1 serving = 1 cup)  Asparagus (cooked, or boiled & drained) Broccoli (cooked, boiled & drained, or raw & chopped) Brussel sprouts (cooked, or boiled & drained) *serving size only =  cup Lettuce, raw (green leaf, endive, romaine) Spinach, raw Turnip greens, raw & chopped   These websites have more information on Coumadin (warfarin):  http://www.king-r[place]ell.com/www.coumadin.com; https://www.hines.net/www.ahrq.gov/consumer/coumadin.htm;     Compression Stockings available at most drug stores.

## 2014-03-01 NOTE — Progress Notes (Signed)
ANTICOAGULATION CONSULT NOTE - Follow Up Consult  Pharmacy Consult for Heparin and warfarin Indication: pulmonary embolus and DVT  No Known Allergies   Patient Measurements: Height: 6\' 4"  (193 cm) Weight: 283 lb 4.7 oz (128.5 kg) IBW/kg (Calculated) : 86.8 Heparin Dosing Weight: 113 kg  Vital Signs: Temp: 98.2 F (36.8 C) (07/12 0447) Temp src: Oral (07/12 0447) BP: 140/69 mmHg (07/12 0447) Pulse Rate: 55 (07/12 0447)  Labs:  Recent Labs  02/27/14 0600 02/27/14 1620 02/28/14 0405 03/01/14 0445  HGB 11.2*  --  10.7* 11.4*  HCT 35.0*  --  32.9* 34.9*  PLT 348  --  397 439*  LABPROT 14.0  --  15.0 18.4*  INR 1.08  --  1.18 1.53*  HEPARINUNFRC 0.26* 0.57 0.38 0.54  CREATININE 1.04  --   --   --     Estimated Creatinine Clearance: 109.2 ml/min (by C-G formula based on Cr of 1.04).  Assessment: [age] YOM with extensive PE and DVT on D#4 out of minimum of 5 of overlap therapy with heparin and warfarin. Heparin level this morning remains therapeutic at 0.54 units/mL on 2150 units/hr. INR increased to 1.53. Hgb low but stable, plts now elevated above normal. No bleeding noted.  Goal of Therapy:  INR 2-3 Heparin level 0.3-0.7 units/ml Monitor platelets by anticoagulation protocol: Yes   Plan:  1. Heparin drip at 2150 units/hr 2. Warfarin 12.5mg  po x1 tonight  3. Daily heparin level, PT/INR and CBC. 4. Follow for s/s bleeding  Jennamarie Goings D. Reace Breshears, PharmD, BCPS Clinical Pharmacist Pager: [phone] 03/01/2014 10:58 AM

## 2014-03-01 NOTE — Progress Notes (Signed)
[facility] PROGRESS NOTE  Jeffery Gamble IRW:[id number] DOB: [birth year] DOA: 02/24/2014 PCP: No primary provider on file.  Assessment/Plan: 1-Multiple acute Pulmonary embolism: Pulmonary infarct.  -Right side heart strain on CT angio. ECHO with possible right heart strain finding.  Doppler; with DVT in left common femoral, profunda and popliteal and posterior tibial veins.  -Continue with heparin Gtt. Pulmonary following. Started on coumadin. Day 4 overlap. INR at 1.5. -ANA negative. , Homocysteine: 12, factor 5 leyden not detected. ESR at 75. PSA at 17.  -Pain better on percocet.  -Need chest x ray prior to discharge.   2-Leukocytosis: UA negative, Blood culture no growth to date.  Urine culture no growth. WBC trending down.   3-Mild fever; could be related to PE. Afebrile.   4-Increase PSA; He will need to follow up with  Urology.   Code Status: Full Code.  Family Communication: care discussed with patient.  Disposition Plan: home when INR at goal.    Consultants:  CCM  Procedures: ECHO: - LVEF 60-65%, mildly dilated RV with indeterminate function, mild TR, RVSP 48 mmHg, moderately dilated RA. findings could be consistent with right heart strain in the setting of pulmonary embolus. No prior echo for comparison.  Doppler: Bilateral lower extremity venous duplex completed. Right: No evidence of DVT, superficial thrombosis, or Baker's cyst. Left: DVT noted in the common femoral, profunda, femoral, popliteal, and posterior tibial veins. No evidence of superficial thrombosis. No Baker's cyst.    Antibiotics:  none  HPI/Subjective: Chest pain controlled with percocet. Get some SOB on exertion.   Objective: Filed Vitals:   03/01/14 1413  BP: 149/69  Pulse: 60  Temp: 98.8 F (37.1 C)  Resp: 18    Intake/Output Summary (Last 24 hours) at 03/01/14 1506 Last data filed at 03/01/14 1300  Gross per 24 hour  Intake    806 ml  Output    700 ml  Net    106 ml   Filed  Weights   02/27/14 0612 02/28/14 0633 03/01/14 0447  Weight: 127.597 kg (281 lb 4.8 oz) 127.96 kg (282 lb 1.6 oz) 128.5 kg (283 lb 4.7 oz)    Exam:   General:  No acute distress.   Cardiovascular: S 1, S 2 RRR  Respiratory: CTA  Abdomen: Bs present, soft, NT  Musculoskeletal: left LE edema.   Data Reviewed: Basic Metabolic Panel:  Recent Labs Lab 02/24/14 1706 02/25/14 0120 02/26/14 0222 02/27/14 0600  NA 137 134* 141 137  K 3.9 3.9 4.1 3.9  CL 96 97 103 99  CO2 '22 21 24 22  ' GLUCOSE 101* 117* 121* 86  BUN '7 7 9 9  ' CREATININE 1.09 1.02 1.23 1.04  CALCIUM 9.2 8.7 8.3* 8.7   Liver Function Tests:  Recent Labs Lab 02/25/14 0120 02/26/14 0222  AST 14 16  ALT 12 14  ALKPHOS 81 78  BILITOT 0.6 0.2*  PROT 7.2 7.0  ALBUMIN 2.8* 2.6*   No results found for this basename: LIPASE, AMYLASE,  in the last 168 hours No results found for this basename: AMMONIA,  in the last 168 hours CBC:  Recent Labs Lab 02/24/14 1706 02/25/14 0120 02/25/14 0845 02/26/14 0222 02/27/14 0600 02/28/14 0405 03/01/14 0445  WBC 13.5* 14.2* 13.4* 11.8* 9.9 9.8 8.9  NEUTROABS  --  10.2*  --   --   --   --   --   HGB 13.1 12.0* 11.7* 11.2* 11.2* 10.7* 11.4*  HCT 39.0 35.5* 35.5* 34.3* 35.0* 32.9* 34.9*  MCV 88.4 87.9 88.5 89.3 90.7 89.2 88.8  PLT 370 347 329 332 348 397 439*   Cardiac Enzymes:  Recent Labs Lab 02/25/14 0120 02/25/14 0712 02/25/14 1255  TROPONINI <0.30 <0.30 <0.30   BNP (last 3 results)  Recent Labs  02/25/14 0120  PROBNP 30.8   CBG:  Recent Labs Lab 02/26/14 1201  GLUCAP 98    Recent Results (from the past 240 hour(s))  URINE CULTURE     Status: None   Collection Time    02/24/14  9:55 PM      Result Value Ref Range Status   Specimen Description URINE, CLEAN CATCH   Final   Special Requests NONE   Final   Culture  Setup Time     Final   Value: 02/24/2014 23:30     Performed at [facility]     Final   Value: NO  GROWTH     Performed at [facility]   Culture     Final   Value: NO GROWTH     Performed at [facility]   Report Status 02/26/2014 FINAL   Final  CULTURE, BLOOD (ROUTINE X 2)     Status: None   Collection Time    02/25/14  2:30 AM      Result Value Ref Range Status   Specimen Description BLOOD LEFT ARM   Final   Special Requests BOTTLES DRAWN AEROBIC AND ANAEROBIC 10CC EACH   Final   Culture  Setup Time     Final   Value: 02/25/2014 08:38     Performed at [facility]   Culture     Final   Value:        BLOOD CULTURE RECEIVED NO GROWTH TO DATE CULTURE WILL BE HELD FOR 5 DAYS BEFORE ISSUING A FINAL NEGATIVE REPORT     Performed at [facility]   Report Status PENDING   Incomplete  CULTURE, BLOOD (ROUTINE X 2)     Status: None   Collection Time    02/25/14  2:38 AM      Result Value Ref Range Status   Specimen Description BLOOD BLOOD LEFT FOREARM   Final   Special Requests BOTTLES DRAWN AEROBIC AND ANAEROBIC 10CC EACH   Final   Culture  Setup Time     Final   Value: 02/25/2014 08:38     Performed at [facility]   Culture     Final   Value:        BLOOD CULTURE RECEIVED NO GROWTH TO DATE CULTURE WILL BE HELD FOR 5 DAYS BEFORE ISSUING A FINAL NEGATIVE REPORT     Performed at [facility]   Report Status PENDING   Incomplete  MRSA PCR SCREENING     Status: None   Collection Time    02/25/14  4:56 AM      Result Value Ref Range Status   MRSA by PCR NEGATIVE  NEGATIVE Final   Comment:            The GeneXpert MRSA Assay (FDA     approved for NASAL specimens     only), is one component of a     comprehensive MRSA colonization     surveillance program. It is not     intended to diagnose MRSA     infection nor to guide or     monitor treatment for     MRSA infections.  Studies: No results found.  Scheduled Meds: . polyethylene glycol  17 g Oral BID  . sodium chloride  3 mL Intravenous Q12H  . warfarin  12.5 mg Oral  ONCE-1800  . Warfarin - Pharmacist Dosing Inpatient   Does not apply q1800   Continuous Infusions: . heparin 2,150 Units/hr (03/01/14 1458)    Active Problems:   PE (pulmonary embolism)   Leukocytosis, unspecified   Fever, unspecified   DVT, femoral, acute    Time spent: 25 minutes.     Niel Hummer A  [facility] Pager [phone]. If 7PM-7AM, please contact night-coverage at [email or web address], password [facility] 03/01/2014, 3:06 PM  LOS: 5 days

## 2014-03-01 NOTE — Evaluation (Signed)
Physical Therapy Evaluation Patient Details Name: Jeffery Gamble MRN: [id number] DOB: [birth year] Today's Date: 03/01/2014   History of Present Illness  Patient is a 63 yo male admitted 02/24/14 with increasing SOB and CP.  Patient with mult Rt lung PE, and LLE DVT.  PMH:  tobacco use.  Patient is truck Hospital doctor.  Clinical Impression  Patient is independent with all mobility and gait.  Good balance/safety with gait.  No acute PT needs identified - PT will sign off.  Encouraged patient to ambulate in hallway with nursing assist.    Follow Up Recommendations No PT follow up;Supervision - Intermittent    Equipment Recommendations  None recommended by PT    Recommendations for Other Services       Precautions / Restrictions Precautions Precautions: None Restrictions Weight Bearing Restrictions: No      Mobility  Bed Mobility                  Transfers Overall transfer level: Independent Equipment used: None                Ambulation/Gait Ambulation/Gait assistance: Independent Ambulation Distance (Feet): 220 Feet Assistive device: None Gait Pattern/deviations: Step-through pattern Gait velocity: Decreased Gait velocity interpretation: Below normal speed for age/gender General Gait Details: Patient with good gait pattern and balance.  Patient reports decreased gait speed due to SOB.  Stairs            Wheelchair Mobility    Modified Rankin (Stroke Patients Only)       Balance Overall balance assessment: No apparent balance deficits (not formally assessed);Independent                                           Pertinent Vitals/Pain     Home Living Family/patient expects to be discharged to:: Private residence Living Arrangements: Spouse/significant other Available Help at Discharge: Family;Available 24 hours/day Type of Home: House Home Access: Stairs to enter Entrance Stairs-Rails: Doctor, general practice of Steps:  5 Home Layout: One level Home Equipment: None      Prior Function Level of Independence: Independent               Hand Dominance        Extremity/Trunk Assessment   Upper Extremity Assessment: Overall WFL for tasks assessed           Lower Extremity Assessment: Overall WFL for tasks assessed (Noted LLE edema)      Cervical / Trunk Assessment: Normal  Communication   Communication: No difficulties  Cognition Arousal/Alertness: Awake/alert Behavior During Therapy: WFL for tasks assessed/performed Overall Cognitive Status: Within Functional Limits for tasks assessed                      General Comments      Exercises        Assessment/Plan    PT Assessment Patent does not need any further PT services  PT Diagnosis     PT Problem List    PT Treatment Interventions     PT Goals (Current goals can be found in the Care Plan section) Acute Rehab PT Goals PT Goal Formulation: No goals set, d/c therapy    Frequency     Barriers to discharge        Co-evaluation               End  of Session Equipment Utilized During Treatment: Gait belt Activity Tolerance: Patient tolerated treatment well Patient left: in bed;with call bell/phone within reach (sitting EOB) Nurse Communication: Mobility status (Encouraged ambulation with nursing)         Time: [id number] PT Time Calculation (min): 25 min   Charges:   PT Evaluation $Initial PT Evaluation Tier I: 1 Procedure PT Treatments $Gait Training: 8-22 mins   PT G Codes:          Vena AustriaDavis, Kaylin Marcon H 03/01/2014, 11:22 AM Durenda HurtSusan H. Renaldo Fiddleravis, PT, [facility] Acute Rehab Services Pager [phone]

## 2014-03-02 ENCOUNTER — Inpatient Hospital Stay (HOSPITAL_COMMUNITY): Payer: Self-pay

## 2014-03-02 LAB — CBC
HCT: 35.6 % — ABNORMAL LOW (ref 39.0–52.0)
Hemoglobin: 11.6 g/dL — ABNORMAL LOW (ref 13.0–17.0)
MCH: 28.9 pg (ref 26.0–34.0)
MCHC: 32.6 g/dL (ref 30.0–36.0)
MCV: 88.6 fL (ref 78.0–100.0)
PLATELETS: 458 10*3/uL — AB (ref 150–400)
RBC: 4.02 MIL/uL — AB (ref 4.22–5.81)
RDW: 15.5 % (ref 11.5–15.5)
WBC: 8.5 10*3/uL (ref 4.0–10.5)

## 2014-03-02 LAB — HEPARIN LEVEL (UNFRACTIONATED): Heparin Unfractionated: 0.69 IU/mL (ref 0.30–0.70)

## 2014-03-02 LAB — BASIC METABOLIC PANEL
ANION GAP: 17 — AB (ref 5–15)
BUN: 10 mg/dL (ref 6–23)
CHLORIDE: 101 meq/L (ref 96–112)
CO2: 21 mEq/L (ref 19–32)
Calcium: 9.1 mg/dL (ref 8.4–10.5)
Creatinine, Ser: 0.95 mg/dL (ref 0.50–1.35)
GFR calc Af Amer: >90 mL/min (ref >90)
GFR calc non Af Amer: 88 mL/min — ABNORMAL LOW (ref >90)
Glucose, Bld: 119 mg/dL — ABNORMAL HIGH (ref 70–99)
Potassium: 4 mEq/L (ref 3.7–5.3)
SODIUM: 139 meq/L (ref 137–147)

## 2014-03-02 LAB — PROTIME-INR
INR: 2.28 — AB (ref 0.00–1.49)
Prothrombin Time: 25.1 seconds — ABNORMAL HIGH (ref 11.6–15.2)

## 2014-03-02 MED ORDER — WARFARIN SODIUM 10 MG PO TABS
10.0000 mg | ORAL_TABLET | Freq: Once | ORAL | Status: AC
  Administered 2014-03-02: 10 mg via ORAL
  Filled 2014-03-02 (×2): qty 1

## 2014-03-02 NOTE — Progress Notes (Signed)
ANTICOAGULATION CONSULT NOTE - Follow Up Consult  Pharmacy Consult for Heparin and Coumadin Indication: pulmonary embolus and DVT  No Known Allergies  Patient Measurements: Height: 6\' 4"  (193 cm) Weight: 284 lb 6.4 oz (129.003 kg) IBW/kg (Calculated) : 86.8 Heparin Dosing Weight: 114 kg  Vital Signs: Temp: 98.1 F (36.7 C) (07/13 0500) Temp src: Oral (07/13 0500) BP: 143/71 mmHg (07/13 0500) Pulse Rate: 62 (07/13 0500)  Labs:  Recent Labs  02/28/14 0405 03/01/14 0445 03/02/14 0520  HGB 10.7* 11.4* 11.6*  HCT 32.9* 34.9* 35.6*  PLT 397 439* 458*  LABPROT 15.0 18.4* 25.1*  INR 1.18 1.53* 2.28*  HEPARINUNFRC 0.38 0.54 0.69  CREATININE  --   --  0.95    Estimated Creatinine Clearance: 119.8 ml/min (by C-G formula based on Cr of 0.95).   Medications:  Scheduled:  . polyethylene glycol  17 g Oral BID  . sodium chloride  3 mL Intravenous Q12H  . Warfarin - Pharmacist Dosing Inpatient   Does not apply q1800   Infusions:  . heparin 2,150 Units/hr (03/02/14 0302)    Assessment: 63 yo M with multiple PE and LLE DVT on day #5 of anticoagulation overlap with Heparin and Coumadin.  Heparin level is therapeutic today on 2150 units/hr.  INR is therapeutic today, rising quickly after multiple Coumadin doses >10mg .  Will reduce dose tonight.  Anticipate discharge tomorrow if INR remains >2.  Goal of Therapy:  INR 2-3 Heparin level 0.3-0.7 units/ml Monitor platelets by anticoagulation protocol: Yes   Plan:  Continue heparin at 2150 units/hr. Coumadin 10 mg PO x 1 tonight. Continue daily heparin level, CBC, and INR. Coumadin education was completed on 7/12.  Toys 'R' UsKimberly Tashon Capp, Pharm.D., BCPS Clinical Pharmacist Pager [phone] 03/02/2014 10:49 AM

## 2014-03-02 NOTE — Progress Notes (Signed)
Pt ambulated 400 feet with RN in hallway; no signs of distress; no c/o pain; pt back to room to bed; wife at bedside; will cont. To monitor.

## 2014-03-02 NOTE — Progress Notes (Signed)
[facility] PROGRESS NOTE  Jeffery Gamble ZTI:[id number] DOB: 03-03-1951 DOA: 02/24/2014 PCP: No primary provider on file.  Assessment/Plan: 1-Multiple acute Pulmonary embolism: Pulmonary infarct.  -Right side heart strain on CT angio. ECHO with possible right heart strain finding.  Doppler; with DVT in left common femoral, profunda and popliteal and posterior tibial veins.  -Continue with heparin Gtt. Pulmonary following. Started on coumadin. Day 5 overlap. INR at 2.2. Continue with heparin Gtt for 24 hours.  -ANA negative. , Homocysteine: 12, factor 5 leyden not detected. ESR at 75. PSA at 17.  -Pain better on percocet.  -Need chest x ray prior to discharge as recommended by Dr wert. Will order it.   2-Leukocytosis: UA negative, Blood culture no growth to date.  Urine culture no growth. WBC trending down.   3-Mild fever; could be related to PE. Afebrile.   4-Increase PSA; He will need to follow up with  Urology.  Code Status: Full Code.  Family Communication: care discussed with patient.  Disposition Plan: home when INR at goal.    Consultants:  CCM  Procedures: ECHO: - LVEF 60-65%, mildly dilated RV with indeterminate function, mild TR, RVSP 48 mmHg, moderately dilated RA. findings could be consistent with right heart strain in the setting of pulmonary embolus. No prior echo for comparison.  Doppler: Bilateral lower extremity venous duplex completed. Right: No evidence of DVT, superficial thrombosis, or Baker's cyst. Left: DVT noted in the common femoral, profunda, femoral, popliteal, and posterior tibial veins. No evidence of superficial thrombosis. No Baker's cyst.    Antibiotics:  none  HPI/Subjective: Feeling better , chest pain controlled.   Objective: Filed Vitals:   03/02/14 1333  BP: 135/65  Pulse: 64  Temp: 97.8 F (36.6 C)  Resp: 18    Intake/Output Summary (Last 24 hours) at 03/02/14 1352 Last data filed at 03/02/14 1230  Gross per 24 hour   Intake  861.5 ml  Output   1501 ml  Net -639.5 ml   Filed Weights   02/28/14 0633 03/01/14 0447 03/02/14 0500  Weight: 127.96 kg (282 lb 1.6 oz) 128.5 kg (283 lb 4.7 oz) 129.003 kg (284 lb 6.4 oz)    Exam:   General:  No acute distress.   Cardiovascular: S 1, S 2 RRR  Respiratory: CTA  Abdomen: Bs present, soft, NT  Musculoskeletal: left LE edema.   Data Reviewed: Basic Metabolic Panel:  Recent Labs Lab 02/24/14 1706 02/25/14 0120 02/26/14 0222 02/27/14 0600 03/02/14 0520  NA 137 134* 141 137 139  K 3.9 3.9 4.1 3.9 4.0  CL 96 97 103 99 101  CO2 '22 21 24 22 21  ' GLUCOSE 101* 117* 121* 86 119*  BUN '7 7 9 9 10  ' CREATININE 1.09 1.02 1.23 1.04 0.95  CALCIUM 9.2 8.7 8.3* 8.7 9.1   Liver Function Tests:  Recent Labs Lab 02/25/14 0120 02/26/14 0222  AST 14 16  ALT 12 14  ALKPHOS 81 78  BILITOT 0.6 0.2*  PROT 7.2 7.0  ALBUMIN 2.8* 2.6*   No results found for this basename: LIPASE, AMYLASE,  in the last 168 hours No results found for this basename: AMMONIA,  in the last 168 hours CBC:  Recent Labs Lab 02/24/14 1706 02/25/14 0120  02/26/14 0222 02/27/14 0600 02/28/14 0405 03/01/14 0445 03/02/14 0520  WBC 13.5* 14.2*  < > 11.8* 9.9 9.8 8.9 8.5  NEUTROABS  --  10.2*  --   --   --   --   --   --  HGB 13.1 12.0*  < > 11.2* 11.2* 10.7* 11.4* 11.6*  HCT 39.0 35.5*  < > 34.3* 35.0* 32.9* 34.9* 35.6*  MCV 88.4 87.9  < > 89.3 90.7 89.2 88.8 88.6  PLT 370 347  < > 332 348 397 439* 458*  < > = values in this interval not displayed. Cardiac Enzymes:  Recent Labs Lab 02/25/14 0120 02/25/14 0712 02/25/14 1255  TROPONINI <0.30 <0.30 <0.30   BNP (last 3 results)  Recent Labs  02/25/14 0120  PROBNP 30.8   CBG:  Recent Labs Lab 02/26/14 1201  GLUCAP 98    Recent Results (from the past 240 hour(s))  URINE CULTURE     Status: None   Collection Time    02/24/14  9:55 PM      Result Value Ref Range Status   Specimen Description URINE, CLEAN  CATCH   Final   Special Requests NONE   Final   Culture  Setup Time     Final   Value: 02/24/2014 23:30     Performed at [facility] Count     Final   Value: NO GROWTH     Performed at [facility]   Culture     Final   Value: NO GROWTH     Performed at [facility]   Report Status 02/26/2014 FINAL   Final  CULTURE, BLOOD (ROUTINE X 2)     Status: None   Collection Time    02/25/14  2:30 AM      Result Value Ref Range Status   Specimen Description BLOOD LEFT ARM   Final   Special Requests BOTTLES DRAWN AEROBIC AND ANAEROBIC 10CC EACH   Final   Culture  Setup Time     Final   Value: 02/25/2014 08:38     Performed at [facility]   Culture     Final   Value:        BLOOD CULTURE RECEIVED NO GROWTH TO DATE CULTURE WILL BE HELD FOR 5 DAYS BEFORE ISSUING A FINAL NEGATIVE REPORT     Performed at [facility]   Report Status PENDING   Incomplete  CULTURE, BLOOD (ROUTINE X 2)     Status: None   Collection Time    02/25/14  2:38 AM      Result Value Ref Range Status   Specimen Description BLOOD BLOOD LEFT FOREARM   Final   Special Requests BOTTLES DRAWN AEROBIC AND ANAEROBIC 10CC EACH   Final   Culture  Setup Time     Final   Value: 02/25/2014 08:38     Performed at [facility]   Culture     Final   Value:        BLOOD CULTURE RECEIVED NO GROWTH TO DATE CULTURE WILL BE HELD FOR 5 DAYS BEFORE ISSUING A FINAL NEGATIVE REPORT     Performed at [facility]   Report Status PENDING   Incomplete  MRSA PCR SCREENING     Status: None   Collection Time    02/25/14  4:56 AM      Result Value Ref Range Status   MRSA by PCR NEGATIVE  NEGATIVE Final   Comment:            The GeneXpert MRSA Assay (FDA     approved for NASAL specimens     only), is one component of a     comprehensive MRSA colonization  surveillance program. It is not     intended to diagnose MRSA     infection nor to guide or     monitor treatment for      MRSA infections.     Studies: No results found.  Scheduled Meds: . polyethylene glycol  17 g Oral BID  . sodium chloride  3 mL Intravenous Q12H  . warfarin  10 mg Oral ONCE-1800  . Warfarin - Pharmacist Dosing Inpatient   Does not apply q1800   Continuous Infusions: . heparin 2,150 Units/hr (03/02/14 0302)    Active Problems:   PE (pulmonary embolism)   Leukocytosis, unspecified   Fever, unspecified   DVT, femoral, acute    Time spent: 25 minutes.     Niel Hummer A  [facility] Pager [phone]. If 7PM-7AM, please contact night-coverage at [email or web address], password [facility] 03/02/2014, 1:52 PM  LOS: 6 days

## 2014-03-02 NOTE — Evaluation (Signed)
Occupational Therapy Evaluation Patient Details Name: Jeffery Gamble MRN: [id number] DOB: 1950/08/30 Today's Date: 03/02/2014    History of Present Illness Patient is a 63 yo male admitted 02/24/14 with increasing SOB and CP.  Patient with mult Rt lung PE, and LLE DVT.  PMH:  tobacco use.  Patient is truck Hospital doctor.   Clinical Impression   Patient evaluated by Occupational Therapy with no further acute OT needs identified. All education has been completed and the patient has no further questions. See below for any follow-up Occupational Therapy or equipment needs. OT to sign off. Thank you for referral.      Follow Up Recommendations  No OT follow up    Equipment Recommendations  None recommended by OT    Recommendations for Other Services       Precautions / Restrictions Precautions Precautions: None      Mobility Bed Mobility Overal bed mobility: Independent                Transfers Overall transfer level: Independent                    Balance Overall balance assessment: No apparent balance deficits (not formally assessed)                                          ADL Overall ADL's : Modified independent                                       General ADL Comments: educated on energy conservation techinques. Pt provided handout and wife present for all education. Pt and wife asking questions. pt selecting two EC techinques to use upon d/c home.     Vision                     Perception     Praxis      Pertinent Vitals/Pain VSS     Hand Dominance Right   Extremity/Trunk Assessment Upper Extremity Assessment Upper Extremity Assessment: Overall WFL for tasks assessed   Lower Extremity Assessment Lower Extremity Assessment: Defer to PT evaluation   Cervical / Trunk Assessment Cervical / Trunk Assessment: Normal   Communication Communication Communication: No difficulties   Cognition  Arousal/Alertness: Awake/alert Behavior During Therapy: WFL for tasks assessed/performed Overall Cognitive Status: Within Functional Limits for tasks assessed                     General Comments       Exercises       Shoulder Instructions      Home Living Family/patient expects to be discharged to:: Private residence Living Arrangements: Spouse/significant other Available Help at Discharge: Family;Available 24 hours/day Type of Home: House Home Access: Stairs to enter [employer] of Steps: 5 Entrance Stairs-Rails: Right;Left Home Layout: One level     Bathroom Shower/Tub: Chief Strategy Officer: Standard     Home Equipment: None          Prior Functioning/Environment Level of Independence: Independent             OT Diagnosis:     OT Problem List:     OT Treatment/Interventions:      OT Goals(Current goals can be found in the care plan section)  OT Frequency:     Barriers to D/C:            Co-evaluation              End of Session    Activity Tolerance: Patient tolerated treatment well Patient left: in bed;with call bell/phone within reach;with family/visitor present   Time: 1420-1430 OT Time Calculation (min): 10 min Charges:  OT General Charges $OT Visit: 1 Procedure OT Evaluation $Initial OT Evaluation Tier I: 1 Procedure G-Codes:    Boone Gamble, Jeffery Graveline B 03/02/2014, 4:30 PM Pager: [phone]

## 2014-03-03 DIAGNOSIS — D72829 Elevated white blood cell count, unspecified: Secondary | ICD-10-CM

## 2014-03-03 LAB — CULTURE, BLOOD (ROUTINE X 2)
CULTURE: NO GROWTH
Culture: NO GROWTH

## 2014-03-03 LAB — CBC
HEMATOCRIT: 33.1 % — AB (ref 39.0–52.0)
HEMOGLOBIN: 10.7 g/dL — AB (ref 13.0–17.0)
MCH: 28.7 pg (ref 26.0–34.0)
MCHC: 32.3 g/dL (ref 30.0–36.0)
MCV: 88.7 fL (ref 78.0–100.0)
Platelets: 454 10*3/uL — ABNORMAL HIGH (ref 150–400)
RBC: 3.73 MIL/uL — AB (ref 4.22–5.81)
RDW: 15.6 % — ABNORMAL HIGH (ref 11.5–15.5)
WBC: 8.2 10*3/uL (ref 4.0–10.5)

## 2014-03-03 LAB — HEPARIN LEVEL (UNFRACTIONATED): Heparin Unfractionated: 0.76 IU/mL — ABNORMAL HIGH (ref 0.30–0.70)

## 2014-03-03 LAB — PROTIME-INR
INR: 2.64 — AB (ref 0.00–1.49)
PROTHROMBIN TIME: 28.2 s — AB (ref 11.6–15.2)

## 2014-03-03 MED ORDER — OXYCODONE-ACETAMINOPHEN 5-325 MG PO TABS: 1.0000 | ORAL_TABLET | Freq: Four times a day (QID) | ORAL | Status: DC | PRN

## 2014-03-03 MED ORDER — WARFARIN SODIUM 10 MG PO TABS: 10.0000 mg | ORAL_TABLET | Freq: Once | ORAL | Status: DC

## 2014-03-03 MED ORDER — POLYETHYLENE GLYCOL 3350 17 G PO PACK: 17.0000 g | PACK | Freq: Two times a day (BID) | ORAL | Status: DC

## 2014-03-03 MED ORDER — WARFARIN SODIUM 10 MG PO TABS
10.0000 mg | ORAL_TABLET | Freq: Once | ORAL | Status: DC
  Filled 2014-03-03: qty 1

## 2014-03-03 MED ORDER — WARFARIN SODIUM 10 MG PO TABS: ORAL_TABLET | ORAL | Status: DC

## 2014-03-03 NOTE — Progress Notes (Signed)
ANTICOAGULATION CONSULT NOTE - Follow Up Consult  Pharmacy Consult for Heparin and Coumadin Indication: pulmonary embolus and DVT  No Known Allergies  Patient Measurements: Height: 6\' 4"  (193 cm) Weight: 274 lb 8 oz (124.512 kg) IBW/kg (Calculated) : 86.8  Vital Signs: Temp: 98.3 F (36.8 C) (07/14 0559) Temp src: Oral (07/14 0559) BP: 139/74 mmHg (07/14 0559) Pulse Rate: 54 (07/14 0559)  Labs:  Recent Labs  03/01/14 0445 03/02/14 0520 03/03/14 0340  HGB 11.4* 11.6* 10.7*  HCT 34.9* 35.6* 33.1*  PLT 439* 458* 454*  LABPROT 18.4* 25.1* 28.2*  INR 1.53* 2.28* 2.64*  HEPARINUNFRC 0.54 0.69 0.76*  CREATININE  --  0.95  --     Estimated Creatinine Clearance: 117.7 ml/min (by C-G formula based on Cr of 0.95).   Medications:  Scheduled:  . polyethylene glycol  17 g Oral BID  . sodium chloride  3 mL Intravenous Q12H  . warfarin  10 mg Oral ONCE-1800  . Warfarin - Pharmacist Dosing Inpatient   Does not apply q1800    Assessment: 63 yo M with multiple PE and LLE DVT on day #6 of Coumadin. Heparin DC'd by [facility], completed 5 day + 24 hour overlap.    INR is therapeutic today.  Anticipate discharge today.    Goal of Therapy:  Goal INR: 2-3 Monitor platelets by anticoagulation protocol: Yes   Plan:  Continue coumadin 10 mg PO.   Continue daily CBC, and INR. Follow up for s/s of bleeding.   Recommend discharge with coumadin 10 mg po qhs. Recommend next INR check on Friday 03/06/14.  Red ChristiansSamson Levander Katzenstein, Pharm. D. Clinical Pharmacy Resident Pager: [phone] Ph: [phone] 03/03/2014 8:51 AM

## 2014-03-03 NOTE — Progress Notes (Signed)
INR therapeutic x 2. Heparin gtt 24 hrs past therapeutic INR. Will d/c heparin per pharmacy recommendation. Craige CottaKirby, NP

## 2014-03-03 NOTE — Progress Notes (Signed)
Patient discharged to home. D/C orders reviewed with patient and patient given prescriptions. Patient states no questions about D/C instructions. Telemetry and IV D/C'd.

## 2014-03-03 NOTE — Discharge Summary (Addendum)
Physician Discharge Summary  [facility] UTM:[id number] DOB: [birth year] DOA: 02/24/2014  PCP: No primary provider on file.  Admit date: 02/24/2014 Discharge date: 03/03/2014  Time spent: 35 minutes  Recommendations for Outpatient Follow-up:  1. Needs INR, adjust coumadin as needed.  2. Needs to follow up with urology due to elevated PSA.  3. Needs appropriate screening for age for malignancy.  4. Needs repeat Chest x ray to follow up infiltrates.   Discharge Diagnoses:    PE (pulmonary embolism)   Leukocytosis, unspecified, resolved.    Fever, unspecified secondary to PE.    Left  DVT, femoral, acute   Discharge Condition: Stable.   Diet recommendation: Heart Healthy  Filed Weights   03/01/14 0447 03/02/14 0500 03/03/14 0559  Weight: 128.5 kg (283 lb 4.7 oz) 129.003 kg (284 lb 6.4 oz) 124.512 kg (274 lb 8 oz)    History of present illness:  History of Present Illness:This is a 63 y.o. year old male with significant past medical history of tobacco abuse presenting with PE. Pt states that he has had progressive SOB and CP for the past 2-3 days. Works as a Administrator. 1 PPD smoker. Had worsening SOB today. No prior medical history. Pt states this is the first time he has been to the doctor. Presented to ER with sxs. Hemodynamically stable. Satting >92% on RA.  Bloodwork essentially WNL apart from WBC 13.6. CT shows multiple R sided PEs in upper lobe and lower lobe. Noted pulmonary artery dilatation, R sided heart strain on CT, patchy infiltrate concerning for atelectasis vs. Pulmonary infarct with small R pleural effusion.    Hospital Course:  1-Multiple acute Pulmonary embolism: Pulmonary infarct.  -Right side heart strain on CT angio. ECHO with possible right heart strain finding. Doppler; with DVT in left common femoral, profunda and popliteal and posterior tibial veins, no mobile.  Pulmonary consulted no need for thrombolysis, patient hemodynamically stable. Also no need for  filter LE DVT no mobile. .  -He was treated with  heparin Gtt. Subsequently he was started on coumadin. He received 6 days overlap. He was overlap 24 hour after INR was at goal.  -ANA negative. , Homocysteine: 12, factor 5 leyden not detected. ESR at 75. PSA at 17.  -Pain better on percocet.  -Dr Nelda Marseille reviewed repeat chest x ray, he consider that pulmonary infarct is stable.  Patient needs repeat Chest x ray out patient.   2-Leukocytosis: UA negative, Blood culture no growth to date. Urine culture no growth. WBC trending down.   3-Mild fever; could be related to PE. Afebrile.   4-Increase PSA; He will need to follow up with Urology. I have mad appointment.    Procedures: ECHO: - LVEF 60-65%, mildly dilated RV with indeterminate function, mild TR, RVSP 48 mmHg, moderately dilated RA. findings could be consistent with right heart strain in the setting of pulmonary embolus. No prior echo for comparison.  Doppler: Bilateral lower extremity venous duplex completed. Right: No evidence of DVT, superficial thrombosis, or Baker's cyst. Left: DVT noted in the common femoral, profunda, femoral, popliteal, and posterior tibial veins. No evidence of superficial thrombosis. No Baker's cyst.   Consultations:  Pulmonary  Discharge Exam: Filed Vitals:   03/03/14 0559  BP: 139/74  Pulse: 54  Temp: 98.3 F (36.8 C)  Resp: 18    General: No distress.  Cardiovascular: S 1, S 2 RRR Respiratory: CTA  Discharge Instructions You were cared for by a hospitalist during your hospital stay. If you  have any questions about your discharge medications or the care you received while you were in the hospital after you are discharged, you can call the unit and asked to speak with the hospitalist on call if the hospitalist that took care of you is not available. Once you are discharged, your primary care physician will handle any further medical issues. Please note that NO REFILLS for any discharge  medications will be authorized once you are discharged, as it is imperative that you return to your primary care physician (or establish a relationship with a primary care physician if you do not have one) for your aftercare needs so that they can reassess your need for medications and monitor your lab values.  Discharge Instructions   Diet - low sodium heart healthy    Complete by:  As directed      Increase activity slowly    Complete by:  As directed             Medication List    STOP taking these medications       oxymetazoline 0.05 % nasal spray  Commonly known as:  AFRIN      TAKE these medications       oxyCODONE-acetaminophen 5-325 MG per tablet  Commonly known as:  PERCOCET/ROXICET  Take 1-2 tablets by mouth every 6 (six) hours as needed for severe pain.     polyethylene glycol packet  Commonly known as:  MIRALAX / GLYCOLAX  Take 17 g by mouth 2 (two) times daily.     warfarin 10 MG tablet  Commonly known as:  COUMADIN  Take 10 mg daily.       No Known Allergies     Follow-up Information   Follow up with Claybon Jabs, MD. (July 30 at 9;45 AM. )    Specialty:  Urology   Contact information:   [place] [address] [phone]       Follow up with [place]     On 03/06/2014. (9:00am   PT/INR bloodwork and hospital follow up appt.;    (please bring photo ID and all medications you are taking to your appointment).)    Contact information:   [place] [address] [phone]      Follow up with Christinia Gully, MD. Call in 1 week.   Specialty:  Pulmonary Disease   Contact information:   [address] [place] [address] [phone]        The results of significant diagnostics from this hospitalization (including imaging, microbiology, ancillary and laboratory) are listed below for reference.    Significant Diagnostic Studies: Dg Chest 2 View  03/02/2014   CLINICAL DATA:   Pulmonary emboli, possible infarct.  EXAM: CHEST  2 VIEW  COMPARISON:  02/27/2014  FINDINGS: Heart size is normal. Small right pleural effusion reidentified with obscuration of the right lung base. Superimposed right lower lobe opacity is slightly more confluent than previously. Linear left lower lobe probable atelectasis is noted. No pleural effusion.  IMPRESSION: Increased right lower lobe consolidation with small pleural effusion which could represent pulmonary infarct although pneumonia or less likely atelectasis could appear similar.   Electronically Signed   By: Conchita Paris M.D.   On: 03/02/2014 15:23   Dg Chest 2 View  02/24/2014   CLINICAL DATA:  Chest and back pain.  EXAM: CHEST  2 VIEW  COMPARISON:  None.  FINDINGS: The cardiac silhouette, mediastinal and hilar  contours are within normal limits. There is tortuosity and calcification of the thoracic aorta. Bibasilar subsegmental atelectasis versus scarring change. No focal airspace consolidation, pulmonary edema or pleural effusion. The bony thorax is intact.  IMPRESSION: Bibasilar atelectasis or scarring changes.   Electronically Signed   By: Kalman Jewels M.D.   On: 02/24/2014 17:46   Ct Angio Chest W/cm &/or Wo Cm  02/25/2014   CLINICAL DATA:  Chest pain, shortness of breath  EXAM: CT ANGIOGRAPHY CHEST WITH CONTRAST  TECHNIQUE: Multidetector CT imaging of the chest was performed using the standard protocol during bolus administration of intravenous contrast. Multiplanar CT image reconstructions and MIPs were obtained to evaluate the vascular anatomy.  CONTRAST:  14m OMNIPAQUE IOHEXOL 350 MG/ML SOLN  COMPARISON:  Prior radiograph performed earlier on the same day.  FINDINGS: Subcentimeter hypodense nodule noted within the left lobe of thyroid. The thyroid gland is otherwise unremarkable.  No pathologically enlarged mediastinal, hilar, or axillary lymph nodes are identified.  Intrathoracic aorta is of normal caliber and appearance. Scattered  atherosclerotic plaque noted within the aortic arch. Great vessels within normal limits.  Heart size is normal.  No pericardial effusion.  Pulmonary arterial tree is well opacified. Multiple filling defects are seen involving the segmental arteries of the right lower lobe, consistent with acute pulmonary emboli (series 4, image 52). There are nonocclusive emboli within the right upper lobe segmental branches as well (series 4, image 36). There is mild straightening of the interventricular septum with elevated RV to LV ratio of 1.4, suggesting right heart strain. Main pulmonary arteries also dilated tip 3.6 cm. No left-sided emboli.  Patchy infiltrate within the right lung base may reflect atelectasis or pulmonary infarct. Small layering right pleural effusion present. The left lung is clear.  Visualized upper abdomen within normal limits.  No acute osseous abnormality. No worrisome lytic or blastic osseous lesions.  Review of the MIP images confirms the above findings.  IMPRESSION: 1. Multiple acute pulmonary emboli involving the right lower and upper lobe segmental branches as above. Straightening of the interventricular septum, main pulmonary artery dilatation, and elevated RV to LV ratio suggests secondary right heart strain. 2. Patchy infiltrate within the right lung base, which may reflect atelectasis and/or pulmonary infarct. 3. Small layering right pleural effusion. Critical Value/emergent results were called by telephone at the time of interpretation on 02/24/2014 at 11:58 PM to Dr. JBaron Sane, who verbally acknowledged these results.   Electronically Signed   By: BJeannine BogaM.D.   On: 02/25/2014 00:04   Dg Chest Port 1 View  02/27/2014   CLINICAL DATA:  Assess infarct.  EXAM: PORTABLE CHEST - 1 VIEW  COMPARISON:  02/24/2014  FINDINGS: Airspace opacity increased in the right lower lobe. This could represent atelectasis or infarct related to previously diagnosed pulmonary emboli. Minimal  left base atelectasis. Heart is borderline in size. Small right pleural effusion.  No acute bony abnormality.  IMPRESSION: Increasing right basilar atelectasis or pulmonary infarct.  Small right pleural effusion.   Electronically Signed   By: KRolm BaptiseM.D.   On: 02/27/2014 08:00    Microbiology: Recent Results (from the past 240 hour(s))  URINE CULTURE     Status: None   Collection Time    02/24/14  9:55 PM      Result Value Ref Range Status   Specimen Description URINE, CLEAN CATCH   Final   Special Requests NONE   Final   Culture  Setup Time  Final   Value: 02/24/2014 23:30     Performed at [facility] Count     Final   Value: NO GROWTH     Performed at [facility]   Culture     Final   Value: NO GROWTH     Performed at [facility]   Report Status 02/26/2014 FINAL   Final  CULTURE, BLOOD (ROUTINE X 2)     Status: None   Collection Time    02/25/14  2:30 AM      Result Value Ref Range Status   Specimen Description BLOOD LEFT ARM   Final   Special Requests BOTTLES DRAWN AEROBIC AND ANAEROBIC 10CC EACH   Final   Culture  Setup Time     Final   Value: 02/25/2014 08:38     Performed at [facility]   Culture     Final   Value: NO GROWTH 5 DAYS     Performed at [facility]   Report Status 03/03/2014 FINAL   Final  CULTURE, BLOOD (ROUTINE X 2)     Status: None   Collection Time    02/25/14  2:38 AM      Result Value Ref Range Status   Specimen Description BLOOD BLOOD LEFT FOREARM   Final   Special Requests BOTTLES DRAWN AEROBIC AND ANAEROBIC 10CC EACH   Final   Culture  Setup Time     Final   Value: 02/25/2014 08:38     Performed at [facility]   Culture     Final   Value: NO GROWTH 5 DAYS     Performed at [facility]   Report Status 03/03/2014 FINAL   Final  MRSA PCR SCREENING     Status: None   Collection Time    02/25/14  4:56 AM      Result Value Ref Range Status   MRSA by PCR NEGATIVE   NEGATIVE Final   Comment:            The GeneXpert MRSA Assay (FDA     approved for NASAL specimens     only), is one component of a     comprehensive MRSA colonization     surveillance program. It is not     intended to diagnose MRSA     infection nor to guide or     monitor treatment for     MRSA infections.     Labs: Basic Metabolic Panel:  Recent Labs Lab 02/24/14 1706 02/25/14 0120 02/26/14 0222 02/27/14 0600 03/02/14 0520  NA 137 134* 141 137 139  K 3.9 3.9 4.1 3.9 4.0  CL 96 97 103 99 101  CO2 '22 21 24 22 21  ' GLUCOSE 101* 117* 121* 86 119*  BUN '7 7 9 9 10  ' CREATININE 1.09 1.02 1.23 1.04 0.95  CALCIUM 9.2 8.7 8.3* 8.7 9.1   Liver Function Tests:  Recent Labs Lab 02/25/14 0120 02/26/14 0222  AST 14 16  ALT 12 14  ALKPHOS 81 78  BILITOT 0.6 0.2*  PROT 7.2 7.0  ALBUMIN 2.8* 2.6*   No results found for this basename: LIPASE, AMYLASE,  in the last 168 hours No results found for this basename: AMMONIA,  in the last 168 hours CBC:  Recent Labs Lab 02/24/14 1706 02/25/14 0120  02/27/14 0600 02/28/14 0405 03/01/14 0445 03/02/14 0520 03/03/14 0340  WBC 13.5* 14.2*  < > 9.9 9.8 8.9 8.5 8.2  NEUTROABS  --  10.2*  --   --   --   --   --   --   HGB 13.1 12.0*  < > 11.2* 10.7* 11.4* 11.6* 10.7*  HCT 39.0 35.5*  < > 35.0* 32.9* 34.9* 35.6* 33.1*  MCV 88.4 87.9  < > 90.7 89.2 88.8 88.6 88.7  PLT 370 347  < > 348 397 439* 458* 454*  < > = values in this interval not displayed. Cardiac Enzymes:  Recent Labs Lab 02/25/14 0120 02/25/14 0712 02/25/14 1255  TROPONINI <0.30 <0.30 <0.30   BNP: BNP (last 3 results)  Recent Labs  02/25/14 0120  PROBNP 30.8   CBG:  Recent Labs Lab 02/26/14 1201  GLUCAP 98       Signed:  Moiz Ryant A  [facility] 03/03/2014, 11:08 AM

## 2014-03-06 ENCOUNTER — Ambulatory Visit: Payer: Self-pay | Attending: Internal Medicine | Admitting: Internal Medicine

## 2014-03-06 VITALS — BP 146/91 | HR 61 | Temp 98.6°F | Resp 16 | Ht 76.0 in | Wt 279.0 lb

## 2014-03-06 DIAGNOSIS — I82409 Acute embolism and thrombosis of unspecified deep veins of unspecified lower extremity: Secondary | ICD-10-CM | POA: Insufficient documentation

## 2014-03-06 DIAGNOSIS — I2699 Other pulmonary embolism without acute cor pulmonale: Secondary | ICD-10-CM | POA: Insufficient documentation

## 2014-03-06 DIAGNOSIS — F17200 Nicotine dependence, unspecified, uncomplicated: Secondary | ICD-10-CM | POA: Insufficient documentation

## 2014-03-06 DIAGNOSIS — I82402 Acute embolism and thrombosis of unspecified deep veins of left lower extremity: Secondary | ICD-10-CM

## 2014-03-06 LAB — POCT INR: INR: 4.2

## 2014-03-06 NOTE — Progress Notes (Signed)
HFU Pt was in the ED having DVT in his left leg and in his right lung.

## 2014-03-06 NOTE — Patient Instructions (Signed)
Pulmonary Embolus A pulmonary (lung) embolus (PE) is a blood clot that has traveled from another place in the body to the lung. Most clots come from deep veins in the legs or pelvis. PE is a dangerous and potentially life-threatening condition that can be treated if identified. CAUSES Blood clots form in a vein for different reasons. Usually several things cause blood clots. They include:  The flow of blood slows down.  The inside of the vein is damaged in some way.  The person has a condition that makes the blood clot more easily. These conditions may include:  Older age (especially over 75 years old).  Having a history of blood clots.  Having major or lengthy surgery. Hip surgery is particularly high-risk.  Breaking a hip or leg.  Sitting or lying still for a long time.  Cancer or cancer treatment.  Having a long, thin tube (catheter) placed inside a vein during a medical procedure.  Being overweight (obese).  Pregnancy and childbirth.  Medicines with estrogen.  Smoking.  Other circulation or heart problems. SYMPTOMS  The symptoms of a PE usually start suddenly and include:  Shortness of breath.  Coughing.  Coughing up blood or blood-tinged mucus (phlegm).  Chest pain. Pain is often worse with deep breaths.  Rapid heartbeat. DIAGNOSIS  If a PE is suspected, your caregiver will take a medical history and carry out a physical exam. Your caregiver will check for the risk factors listed above. Tests that also may be required include:  Blood tests, including studies of the clotting properties of your blood.  Imaging tests. Ultrasound, CT, MRI, and other tests can all be used to see if you have clots in your legs or lungs. If you have a clot in your legs and have breathing or chest problems, your caregiver may conclude that you have a clot in your lungs. Further lung tests may not be needed.  Electrocardiography can look for heart strain from blood clots in the  lungs. PREVENTION   Exercise the legs regularly. Take a brisk 30 minute walk every day.  Maintain a weight that is appropriate for your height.  Avoid sitting or lying in bed for long periods of time without moving your legs.  Women, particularly those over the age of 35, should consider the risks and benefits of taking estrogen medicines, including birth control pills.  Do not smoke, especially if you take estrogen medicines.  Long-distance travel can increase your risk. You should exercise your legs by walking or pumping the muscles every hour.  In hospital prevention:  Your caregiver will assess your need for preventive PE care (prophylaxis) when you are admitted to the hospital. If you are having surgery, your surgeon will assess you the day of or day after surgery.  Prevention may include medical and nonmedical measures. TREATMENT   The most common treatment for a PE is blood thinning (anticoagulant) medicine, which reduces the blood's tendency to clot. Anticoagulants can stop new blood clots from forming and old ones from growing. They cannot dissolve existing clots. Your body does this by itself over time. Anticoagulants can be given by mouth, by intravenous (IV) access, or by injection. Your caregiver will determine the best program for you.  Less commonly, clot-dissolving drugs (thrombolytics) are used to dissolve a PE. They carry a high risk of bleeding, so they are used mainly in severe cases.  Very rarely, a blood clot in the leg needs to be removed surgically.  If you are unable to   take anticoagulants, your caregiver may arrange for you to have a filter placed in a main vein in your abdomen. This filter prevents clots from traveling to your lungs. HOME CARE INSTRUCTIONS   Take all medicines prescribed by your caregiver. Follow the directions carefully.  Warfarin. Most people will continue taking warfarin after hospital discharge. Your caregiver will advise you on the  length of treatment (usually 3-6 months, sometimes lifelong).  Too much and too little warfarin are both dangerous. Too much warfarin increases the risk of bleeding. Too little warfarin continues to allow the risk for blood clots. While taking warfarin, you will need to have regular blood tests to measure your blood clotting time. These blood tests usually include both the prothrombin time (PT) and International Normalized Ratio (INR) tests. The PT and INR results allow your caregiver to adjust your dose of warfarin. The dose can change for many reasons. It is critically important that you take warfarin exactly as prescribed, and that you have your PT and INR levels drawn exactly as directed.  Many foods, especially foods high in vitamin K can interfere with warfarin and affect the PT and INR results. Foods high in vitamin K include spinach, kale, broccoli, cabbage, collard and turnip greens, brussels sprouts, peas, cauliflower, seaweed, and parsley as well as beef and pork liver, green tea, and soybean oil. You should eat a consistent amount of foods high in vitamin K. Avoid major changes in your diet, or notify your caregiver before changing your diet. Arrange a visit with a dietitian to answer your questions.  Many medicines can interfere with warfarin and affect the PT and INR results. You must tell your caregiver about any and all medicines you take, this includes all vitamins and supplements. Be especially cautious with aspirin and anti-inflammatory medicines. Ask your caregiver before taking these. Do not take or discontinue any prescribed or over-the-counter medicine except on the advice of your caregiver or pharmacist.  Warfarin can have side effects, such as excessive bruising or bleeding. You will need to hold pressure over cuts for longer than usual.  Alcohol can change the body's ability to handle warfarin. It is best to avoid alcoholic drinks or consume only very small amounts while taking  warfarin. Notify your caregiver if you change your alcohol intake.  Notify your dentist or other caregivers before procedures.  Avoid contact sports.  Wear a medical alert bracelet or carry a medical alert card.  Ask your caregiver how soon you can go back to normal activities. Not being active can lead to new clots. Ask for a list of what you should and should not do.  Compression stockings. These are tight elastic stockings that apply pressure to the lower legs. This can help keep the blood in the legs from clotting. You may need to wear compressions stockings at home to help prevent clots.  Smoking. If you smoke, quit. Ask your caregiver for help with quitting smoking.  Learn as much as you can about PE. Educating yourself can help prevent PE from reoccurring. SEEK MEDICAL CARE IF:   You notice a rapid heartbeat.  You feel weaker or more tired than usual.  You feel faint.  You notice increased bruising.  Your symptoms are not getting better in the time expected.  You are having side effects of medicine. SEEK IMMEDIATE MEDICAL CARE IF:   You have chest pain.  You have trouble breathing.  You have new or increased swelling or pain in one leg.  You cough  up blood.  You notice blood in vomit, in a bowel movement, or in urine.  You have an oral temperature above 102 F (38.9 C), not controlled by medicine. You may have another PE. A blood clot in the lungs is a medical emergency. Call your local emergency services (911 in [place] to get to the nearest hospital or clinic. Do not drive yourself. MAKE SURE YOU:   Understand these instructions.  Will watch your condition.  Will get help right away if you are not doing well or get worse. Document Released: 08/04/2000 Document Revised: 02/06/2012 Document Reviewed: 02/08/2009 [facility] Patient Information 2015 [place], [place]. This information is not intended to replace advice given to you by your health care provider. Make  sure you discuss any questions you have with your health care provider. Deep Vein Thrombosis A deep vein thrombosis (DVT) is a blood clot that develops in the deep, larger veins of the leg, arm, or pelvis. These are more dangerous than clots that might form in veins near the surface of the body. A DVT can lead to complications if the clot breaks off and travels in the bloodstream to the lungs.  A DVT can damage the valves in your leg veins, so that instead of flowing upward, the blood pools in the lower leg. This is called post-thrombotic syndrome, and it can result in pain, swelling, discoloration, and sores on the leg. CAUSES Usually, several things contribute to blood clots forming. Contributing factors include:  The flow of blood slows down.  The inside of the vein is damaged in some way.  You have a condition that makes blood clot more easily. RISK FACTORS Some people are more likely than others to develop blood clots. Risk factors include:   Older age, especially over 44 years of age.  Having a family history of blood clots or if you have already had a blot clot.  Having major or lengthy surgery. This is especially true for surgery on the hip, knee, or belly (abdomen). Hip surgery is particularly high risk.  Breaking a hip or leg.  Sitting or lying still for a long time. This includes long-distance travel, paralysis, or recovery from an illness or surgery.  Having cancer or cancer treatment.  Having a long, thin tube (catheter) placed inside a vein during a medical procedure.  Being overweight (obese).  Pregnancy and childbirth.  Hormone changes make the blood clot more easily during pregnancy.  The fetus puts pressure on the veins of the pelvis.  There is a risk of injury to veins during delivery or a caesarean. The risk is highest just after childbirth.  Medicines with the male hormone estrogen. This includes birth control pills and hormone replacement  therapy.  Smoking.  Other circulation or heart problems.  SIGNS AND SYMPTOMS When a clot forms, it can either partially or totally block the blood flow in that vein. Symptoms of a DVT can include:  Swelling of the leg or arm, especially if one side is much worse.  Warmth and redness of the leg or arm, especially if one side is much worse.  Pain in an arm or leg. If the clot is in the leg, symptoms may be more noticeable or worse when standing or walking. The symptoms of a DVT that has traveled to the lungs (pulmonary embolism, PE) usually start suddenly and include:  Shortness of breath.  Coughing.  Coughing up blood or blood-tinged phlegm.  Chest pain. The chest pain is often worse with deep breaths.  Rapid heartbeat. Anyone with these symptoms should get emergency medical treatment right away. Call your local emergency services (911 in the [place] if you have these symptoms. DIAGNOSIS If a DVT is suspected, your health care provider will take a full medical history and perform a physical exam. Tests that also may be required include:  Blood tests, including studies of the clotting properties of the blood.  Ultrasonography to see if you have clots in your legs or lungs.  X-rays to show the flow of blood when dye is injected into the veins (venography).  Studies of your lungs if you have any chest symptoms. PREVENTION  Exercise the legs regularly. Take a brisk 30-minute walk every day.  Maintain a weight that is appropriate for your height.  Avoid sitting or lying in bed for long periods of time without moving your legs.  Women, particularly those over the age of 35 years, should consider the risks and benefits of taking estrogen medicines, including birth control pills.  Do not smoke, especially if you take estrogen medicines.  Long-distance travel can increase your risk of DVT. You should exercise your legs by walking or pumping the muscles every hour.  In-hospital  prevention:  Many of the risk factors above relate to situations that exist with hospitalization, either for illness, injury, or elective surgery.  Your health care provider will assess you for the need for venous thromboembolism prophylaxis when you are admitted to the hospital. If you are having surgery, your surgeon will assess you the day of or day after surgery.  Prevention may include medical and nonmedical measures. TREATMENT Once identified, a DVT can be treated. It can also be prevented in some circumstances. Once you have had a DVT, you may be at increased risk for a DVT in the future. The most common treatment for DVT is blood thinning (anticoagulant) medicine, which reduces the blood's tendency to clot. Anticoagulants can stop new blood clots from forming and stop old ones from growing. They cannot dissolve existing clots. Your body does this by itself over time. Anticoagulants can be given by mouth, by IV access, or by injection. Your health care provider will determine the best program for you. Other medicines or treatments that may be used are:  Heparin or related medicines (low molecular weight heparin) are usually the first treatment for a blood clot. They act quickly. However, they cannot be taken orally.  Heparin can cause a fall in a component of blood that stops bleeding and forms blood clots (platelets). You will be monitored with blood tests to be sure this does not occur.  Warfarin is an anticoagulant that can be swallowed. It takes a few days to start working, so usually heparin or related medicines are used in combination. Once warfarin is working, heparin is usually stopped.  Less commonly, clot dissolving drugs (thrombolytics) are used to dissolve a DVT. They carry a high risk of bleeding, so they are used mainly in severe cases, where your life or a limb is threatened.  Very rarely, a blood clot in the leg needs to be removed surgically.  If you are unable to take  anticoagulants, your health care provider may arrange for you to have a filter placed in a main vein in your abdomen. This filter prevents clots from traveling to your lungs. HOME CARE INSTRUCTIONS  Take all medicines prescribed by your health care provider. Only take over-the-counter or prescription medicines for pain, fever, or discomfort as directed by your health care provider.  Warfarin. Most people will continue taking warfarin after hospital discharge. Your health care provider will advise you on the length of treatment (usually 3-6 months, sometimes lifelong).  Too much and too little warfarin are both dangerous. Too much warfarin increases the risk of bleeding. Too little warfarin continues to allow the risk for blood clots. While taking warfarin, you will need to have regular blood tests to measure your blood clotting time. These blood tests usually include both the prothrombin time (PT) and international normalized ratio (INR) tests. The PT and INR results allow your health care provider to adjust your dose of warfarin. The dose can change for many reasons. It is critically important that you take warfarin exactly as prescribed, and that you have your PT and INR levels drawn exactly as directed.  Many foods, especially foods high in vitamin K, can interfere with warfarin and affect the PT and INR results. Foods high in vitamin K include spinach, kale, broccoli, cabbage, collard and turnip greens, brussel sprouts, peas, cauliflower, seaweed, and parsley as well as beef and pork liver, green tea, and soybean oil. You should eat a consistent amount of foods high in vitamin K. Avoid major changes in your diet, or notify your health care provider before changing your diet. Arrange a visit with a dietitian to answer your questions.  Many medicines can interfere with warfarin and affect the PT and INR results. You must tell your health care provider about any and all medicines you take. This includes  all vitamins and supplements. Be especially cautious with aspirin and anti-inflammatory medicines. Ask your health care provider before taking these. Do not take or discontinue any prescribed or over-the-counter medicine except on the advice of your health care provider or pharmacist.  Warfarin can have side effects, primarily excessive bruising or bleeding. You will need to hold pressure over cuts for longer than usual. Your health care provider or pharmacist will discuss other potential side effects.  Alcohol can change the body's ability to handle warfarin. It is best to avoid alcoholic drinks or consume only very small amounts while taking warfarin. Notify your health care provider if you change your alcohol intake.  Notify your dentist or other health care providers before procedures.  Activity. Ask your health care provider how soon you can go back to normal activities. It is important to stay active to prevent blood clots. If you are on anticoagulant medicine, avoid contact sports.  Exercise. It is very important to exercise. This is especially important while traveling, sitting, or standing for long periods of time. Exercise your legs by walking or by pumping the muscles frequently. Take frequent walks.  Compression stockings. These are tight elastic stockings that apply pressure to the lower legs. This pressure can help keep the blood in the legs from clotting. You may need to wear compression stockings at home to help prevent a DVT.  Do not smoke. If you smoke, quit. Ask your health care provider for help with quitting smoking.  Learn as much as you can about DVT. Knowing more about the condition should help you keep it from coming back.  Wear a medical alert bracelet or carry a medical alert card. SEEK MEDICAL CARE IF:  You notice a rapid heartbeat.  You feel weaker or more tired than usual.  You feel faint.  You notice increased bruising.  You feel your symptoms are not  getting better in the time expected.  You believe you are having side effects of medicine. SEEK IMMEDIATE  MEDICAL CARE IF:  You have chest pain.  You have trouble breathing.  You have new or increased swelling or pain in one leg.  You cough up blood.  You notice blood in vomit, in a bowel movement, or in urine. MAKE SURE YOU:  Understand these instructions.  Will watch your condition.  Will get help right away if you are not doing well or get worse. Document Released: 08/07/2005 Document Revised: 05/28/2013 Document Reviewed: 04/14/2013 [facility] Patient Information 2015 [place], [place]. This information is not intended to replace advice given to you by your health care provider. Make sure you discuss any questions you have with your health care provider. Warfarin: What You Need to Know Warfarin is an anticoagulant. Anticoagulants help prevent the formation of blood clots. They also help stop the growth of blood clots. Warfarin is sometimes referred to as a "blood thinner."  Normally, when body tissues are cut or damaged, the blood clots in order to prevent blood loss. Sometimes clots form inside your blood vessels and obstruct the flow of blood through your circulatory system (thrombosis). These clots may travel through your bloodstream and become lodged in smaller blood vessels in your brain, which can cause a stroke, or in your lungs (pulmonary embolism). WHO SHOULD USE WARFARIN? Warfarin is prescribed for people at risk of developing harmful blood clots:  People with surgically implanted mechanical heart valves, irregular heart rhythms called atrial fibrillation, and certain clotting disorders.  People who have developed harmful blood clotting in the past, including those who have had a stroke or a pulmonary embolism, or thrombosis in their legs (deep vein thrombosis [DVT]).  People with an existing blood clot, such as a pulmonary embolism. WARFARIN DOSING Warfarin tablets come  in different strengths. Each tablet strength is a different color, with the amount of warfarin (in milligrams) clearly printed on the tablet. If the color of your tablet is different than usual when you receive a new prescription, report it immediately to your pharmacist or health care provider. WARFARIN MONITORING The goal of warfarin therapy is to lessen the clotting tendency of blood but not prevent clotting completely. Your health care provider will monitor the anticoagulation effect of warfarin closely and adjust your dose as needed. For your safety, blood tests called prothrombin time (PT) or international normalized ratio (INR) are used to measure the effects of warfarin. Both of these tests can be done with a finger stick or a blood draw. The longer it takes the blood to clot, the higher the PT or INR. Your health care provider will inform you of your "target" PT or INR range. If, at any time, your PT or INR is above the target range, there is a risk of bleeding. If your PT or INR is below the target range, there is a risk of clotting. Whether you are started on warfarin while you are in the hospital or in your health care provider's office, you will need to have your PT or INR checked within one week of starting the medicine. Initially, some people are asked to have their PT or INR checked as much as twice a week. Once you are on a stable maintenance dose, the PT or INR is checked less often, usually once every 2 to 4 weeks. The warfarin dose may be adjusted if the PT or INR is not within the target range. It is important to keep all laboratory and health care provider follow-up appointments. Not keeping appointments could result in a chronic or permanent  injury, pain, or disability because warfarin is a medicine that requires close monitoring. WHAT ARE THE SIDE EFFECTS OF WARFARIN?  Too much warfarin can cause bleeding (hemorrhage) from any part of the body. This may include bleeding from the gums,  blood in the urine, bloody or dark stools, a nosebleed that is not easily stopped, coughing up blood, or vomiting blood.  Too little warfarin can increase the risk of blood clots.  Too little or too much warfarin can also increase the risk of a stroke.  Warfarin use may cause a skin rash or irritation, an unusual fever, continual nausea or stomach upset, or severe pain in your joints or back. SPECIAL PRECAUTIONS WHILE TAKING WARFARIN Warfarin should be taken exactly as directed. It is very important to take warfarin as directed since bleeding or blood clots could result in chronic or permanent injury, pain, or disability.  Take your medicine at the same time every day. If you forget to take your dose, you can take it if it is within 6 hours of when it was due.  Do not change the dose of warfarin on your own to make up for missed or extra doses.  If you miss more than 2 doses in a row, you should contact your health care provider for advice. Avoid situations that cause bleeding. You may have a tendency to bleed more easily than usual while taking warfarin. The following actions can limit bleeding:  Using a softer toothbrush.  Flossing with waxed floss rather than unwaxed floss.  Shaving with an Neurosurgeon rather than a blade.  Limiting the use of sharp objects.  Avoiding potentially harmful activities, such as contact sports. Warfarin and Pregnancy or Breastfeeding  Warfarin is not advised during the first trimester of pregnancy due to an increased risk of birth defects. In certain situations, a woman may take warfarin after her first trimester of pregnancy. A woman who becomes pregnant or plans to become pregnant while taking warfarin should notify her health care provider immediately.  Although warfarin does not pass into breast milk, a woman who wishes to breastfeed while taking warfarin should also consult with her health care provider. Alcohol, Smoking, and Illicit Drug  Use  Alcohol affects how warfarin works in the body. It is best to avoid alcoholic drinks or consume very small amounts while taking warfarin. In general, alcohol intake should be limited to 1 oz (30 mL) of liquor, 6 oz (180 mL) of wine, or 12 oz (360 mL) of beer each day. Notify your health care provider if you change your alcohol intake.  Smoking affects how warfarin works. It is best to avoid smoking while taking warfarin. Notify your health care provider if you change your smoking habits.  It is best to avoid all illicit drugs while taking warfarin since there are few studies that show how warfarin interacts with these drugs. Other Medicines and Dietary Supplements Many prescription and over-the-counter medicines can interfere with warfarin. Be sure all of your health care providers know you are taking warfarin. Notify your health care provider who prescribed warfarin for you or your pharmacist before starting or stopping any new medicines, including over-the-counter vitamins, dietary supplements, and pain medicines. Your warfarin dose may need to be adjusted. Some common over-the-counter medicines that may increase the risk of bleeding while taking warfarin include:   Acetaminophen.  Aspirin.  Nonsteroidal anti-inflammatory medicines (NSAIDs), such as ibuprofen or naproxen.  Vitamin E. Dietary Considerations  Foods that have moderate or high amounts of  vitamin K can interfere with warfarin. Avoid major changes in your diet or notify your health care provider before changing your diet. Eat a consistent amount of foods that have moderate or high amounts of vitamin K. Eating less foods containing vitamin K can increase the risk of bleeding. Eating more foods containing vitamin K can increase the risk of blood clots. Additional questions about dietary considerations can be discussed with a dietitian. Foods that are very high in vitamin K:  Greens, such as Swiss chard and beet, collard,  mustard, or turnip greens (fresh or frozen, cooked).  Kale (fresh or frozen, cooked).  Parsley (raw).  Spinach (cooked). Foods that are high in vitamin K:  Asparagus (frozen, cooked).  Beans, green (frozen, cooked).  Broccoli.  Bok choy (cooked).  Brussels sprouts (fresh or frozen, cooked).  Cabbage (cooked).   Coleslaw. Foods that are moderately high in vitamin K:  Blueberries.  Black-eyed peas.  Endive (raw).  Green leaf lettuce (raw).  Green scallions (raw).  Kale (raw).  Okra (frozen, cooked).  Plantains (fried).  Romaine lettuce (raw).  Sauerkraut (canned).  Spinach (raw). CALL YOUR CLINIC OR HEALTH CARE PROVIDER IF YOU:  Plan to have any surgery or procedure.  Feel sick, especially if you have diarrhea or vomiting.  Experience or anticipate any major changes in your diet.  Start or stop a prescription or over-the-counter medicine.  Become, plan to become, or think you may be pregnant.  Are having heavier than usual menstrual periods.  Have had a fall, accident, or any symptoms of bleeding or unusual bruising.  Develop an unusual fever. CALL 911 IN THE [place] OR GO TO THE EMERGENCY DEPARTMENT IF YOU:   Think you may be having an allergic reaction to warfarin. The signs of an allergic reaction could include itching, rash, hives, swelling, chest tightness, or trouble breathing.  See signs of blood in your urine. The signs could include reddish, pinkish, or tea-colored urine.  See signs of blood in your stools. The signs could include bright red or black stools.  Vomit or cough up blood. In these instances, the blood could have either a bright red or a "coffee-grounds" appearance.  Have bleeding that will not stop after applying pressure for 30 minutes such as cuts, nosebleeds, or other injuries.  Have severe pain in your joints or back.  Have a new and severe headache.  Have sudden weakness or numbness of your face, arm, or leg,  especially on one side of your body.  Have sudden confusion or trouble understanding.  Have sudden trouble seeing in one or both eyes.  Have sudden trouble walking, dizziness, loss of balance, or coordination.  Have trouble speaking or understanding (aphasia). Document Released: 08/07/2005 Document Revised: 08/12/2013 Document Reviewed: 01/31/2013 [facility] Patient Information 2015 [place], [place]. This information is not intended to replace advice given to you by your health care provider. Make sure you discuss any questions you have with your health care provider.

## 2014-03-06 NOTE — Progress Notes (Addendum)
Patient ID: Jeffery Gamble, male   DOB: 1951-02-05, 63 y.o.   MRN: [id number]  CC: Hospital followup  HPI: 63 year old male with recent hospitalization for acute DVT and pulmonary embolism, also possible pulmonary infarct. Patient was started on Coumadin in hospital and is here to recheck INR.  No Known Allergies History reviewed. No pertinent past medical history. Current Outpatient Prescriptions on File Prior to Visit  Medication Sig Dispense Refill  . oxyCODONE-acetaminophen (PERCOCET/ROXICET) 5-325 MG per tablet Take 1-2 tablets by mouth every 6 (six) hours as needed for severe pain.  30 tablet  0  . warfarin (COUMADIN) 10 MG tablet Take 10 mg daily.  30 tablet  0  . polyethylene glycol (MIRALAX / GLYCOLAX) packet Take 17 g by mouth 2 (two) times daily.  14 each  0   No current facility-administered medications on file prior to visit.   History reviewed. No pertinent family history. History   Social History  . Marital Status: Married    Spouse Name: N/A    Number of Children: N/A  . Years of Education: N/A   Occupational History  . Not on file.   Social History Main Topics  . Smoking status: Current Every Day Smoker -- 1.00 packs/day    Types: Cigarettes  . Smokeless tobacco: Not on file  . Alcohol Use: Yes  . Drug Use: No  . Sexual Activity: Not on file   Other Topics Concern  . Not on file   Social History Narrative  . No narrative on file    Review of Systems  Constitutional: Negative for fever, chills, diaphoresis, activity change, appetite change and fatigue.  HENT: Negative for ear pain, nosebleeds, congestion, facial swelling, rhinorrhea, neck pain, neck stiffness and ear discharge.   Eyes: Negative for pain, discharge, redness, itching and visual disturbance.  Respiratory: Negative for cough, choking, chest tightness, shortness of breath, wheezing and stridor.   Cardiovascular: Negative for chest pain, palpitations and leg swelling.  Gastrointestinal:  Negative for abdominal distention.  Genitourinary: Negative for dysuria, urgency, frequency, hematuria, flank pain, decreased urine volume, difficulty urinating and dyspareunia.  Musculoskeletal: Negative for back pain, joint swelling, arthralgias and gait problem.  Neurological: Negative for dizziness, tremors, seizures, syncope, facial asymmetry, speech difficulty, weakness, light-headedness, numbness and headaches.  Hematological: Negative for adenopathy. Does not bruise/bleed easily.  Psychiatric/Behavioral: Negative for hallucinations, behavioral problems, confusion, dysphoric mood, decreased concentration and agitation.    Objective:   Filed Vitals:   03/06/14 0922  BP: 146/91  Pulse: 61  Temp: 98.6 F (37 C)  Resp: 16    Physical Exam  Constitutional: Appears well-developed and well-nourished. No distress.  HENT: Normocephalic. External right and left ear normal. Oropharynx is clear and moist.  Eyes: Conjunctivae and EOM are normal. PERRLA, no scleral icterus.  Neck: Normal ROM. Neck supple. No JVD. No tracheal deviation. No thyromegaly.  CVS: RRR, S1/S2 +, no murmurs, no gallops, no carotid bruit.  Pulmonary: Effort and breath sounds normal, no stridor, rhonchi, wheezes, rales.  Abdominal: Soft. BS +,  no distension, tenderness, rebound or guarding.  Musculoskeletal: Normal range of motion. No edema and no tenderness.  Lymphadenopathy: No lymphadenopathy noted, cervical, inguinal. Neuro: Alert. Normal reflexes, muscle tone coordination. No cranial nerve deficit. Skin: Skin is warm and dry. No rash noted. Not diaphoretic. No erythema. No pallor.  Psychiatric: Normal mood and affect. Behavior, judgment, thought content normal.   Lab Results  Component Value Date   WBC 8.2 03/03/2014   HGB 10.7* 03/03/2014  HCT 33.1* 03/03/2014   MCV 88.7 03/03/2014   PLT 454* 03/03/2014   Lab Results  Component Value Date   CREATININE 0.95 03/02/2014   BUN 10 03/02/2014   NA 139 03/02/2014    K 4.0 03/02/2014   CL 101 03/02/2014   CO2 21 03/02/2014    No results found for this basename: HGBA1C   Lipid Panel  No results found for this basename: chol, trig, hdl, cholhdl, vldl, ldlcalc       Assessment and plan:   Patient Active Problem List   Diagnosis Date Noted  . DVT, femoral, acute 02/26/2014    Priority: Medium - recheck INR  . PE (pulmonary embolism) 02/25/2014    Priority: Medium - rechecked INR - 4.2; instructed to hold Coumadin Saturday and then on Sunday to take half a pill, 5 mg. Monday he will come back to recheck INR. - obtain CXR per hosp MD recommendations will to followup pulmonary infarct

## 2014-03-09 ENCOUNTER — Ambulatory Visit: Payer: Self-pay | Attending: Internal Medicine

## 2014-03-09 DIAGNOSIS — I82409 Acute embolism and thrombosis of unspecified deep veins of unspecified lower extremity: Secondary | ICD-10-CM

## 2014-03-09 LAB — POCT INR
INR: 1.827
INR: 1.8

## 2014-03-09 NOTE — Patient Instructions (Signed)
Take 2.5 mg tablet tonight from Monday to Sunday and return next week for recheck

## 2014-03-20 ENCOUNTER — Ambulatory Visit: Payer: Self-pay | Attending: Internal Medicine

## 2014-03-20 DIAGNOSIS — J810 Acute pulmonary edema: Secondary | ICD-10-CM

## 2014-03-20 DIAGNOSIS — I82409 Acute embolism and thrombosis of unspecified deep veins of unspecified lower extremity: Secondary | ICD-10-CM

## 2014-03-20 LAB — POCT INR: INR: 1

## 2014-03-20 MED ORDER — WARFARIN SODIUM 2.5 MG PO TABS: 2.5000 mg | ORAL_TABLET | Freq: Every day | ORAL | Status: DC

## 2014-03-20 NOTE — Patient Instructions (Signed)
Pt instructed to take 12.5 mg tab today and tomorrow and return on Monday Pt instructed not to take Monday dose before coming to clinic

## 2014-03-20 NOTE — Progress Notes (Unsigned)
Pt comes in for repeat INR check. Pt admits he wasn't taking medication as directed. INR 1.0 today

## 2014-03-23 ENCOUNTER — Ambulatory Visit: Payer: Self-pay | Attending: Internal Medicine

## 2014-03-23 DIAGNOSIS — J810 Acute pulmonary edema: Secondary | ICD-10-CM

## 2014-03-23 LAB — POCT INR
INR: 1.8
INR: 1.8

## 2014-03-23 NOTE — Patient Instructions (Signed)
Take 1 tablet every day and return next week

## 2014-03-30 ENCOUNTER — Ambulatory Visit: Payer: Self-pay | Attending: Internal Medicine

## 2014-03-30 DIAGNOSIS — J810 Acute pulmonary edema: Secondary | ICD-10-CM

## 2014-03-30 LAB — POCT INR
INR: 4.9
INR: 4.9

## 2014-03-30 NOTE — Progress Notes (Unsigned)
Pt INR 4.9  Admits to drinking 12 beers yesterday and eating a lot of salads Denies bleeding,denies missed doses

## 2014-03-30 NOTE — Patient Instructions (Signed)
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

## 2014-04-01 ENCOUNTER — Ambulatory Visit: Payer: Self-pay | Attending: Internal Medicine

## 2014-04-01 DIAGNOSIS — I2699 Other pulmonary embolism without acute cor pulmonale: Secondary | ICD-10-CM

## 2014-04-01 LAB — POCT INR: INR: 2

## 2015-04-28 ENCOUNTER — Ambulatory Visit: Payer: Self-pay | Attending: Internal Medicine

## 2015-09-18 IMAGING — CR DG CHEST 1V PORT
2 series · 2 of 2 positions shown · non-contrast
Comparison: 02/24/2014

CLINICAL DATA: Assess infarct.

EXAM:
PORTABLE CHEST - 1 VIEW

[AP (1 of 2)]
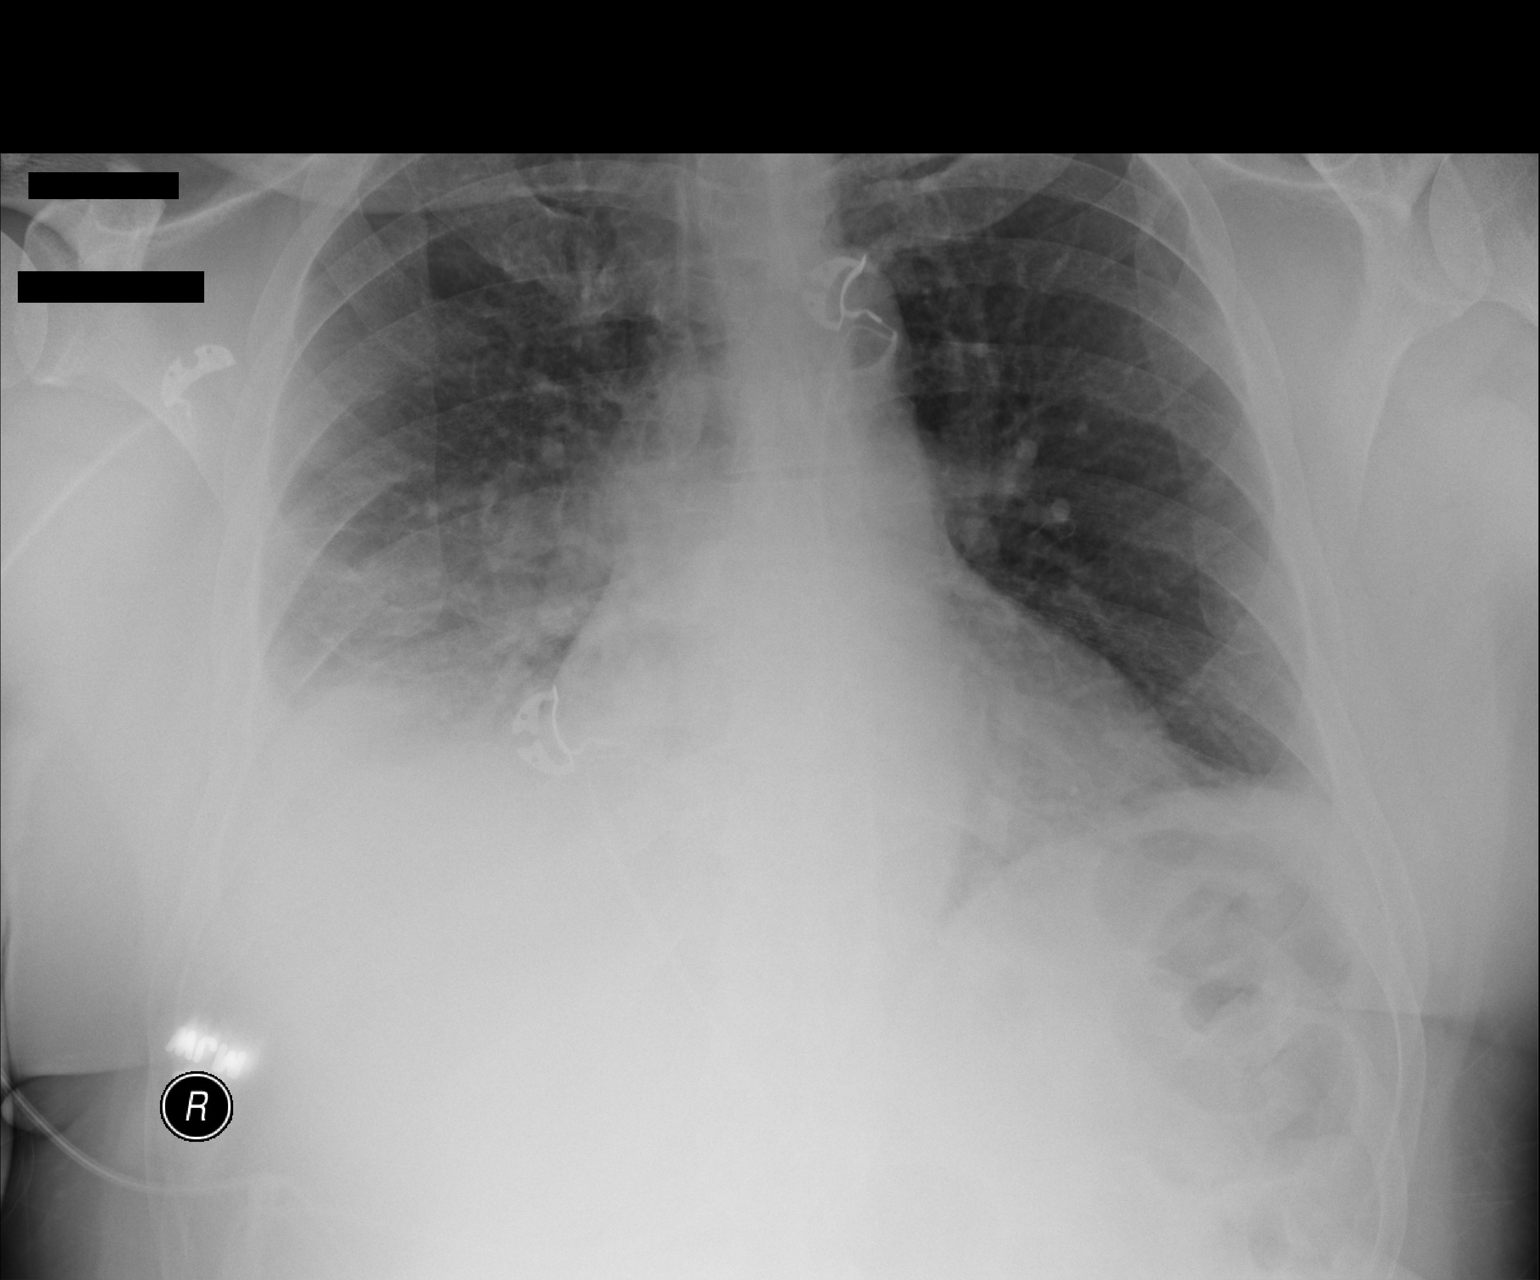

[AP (2 of 2)]
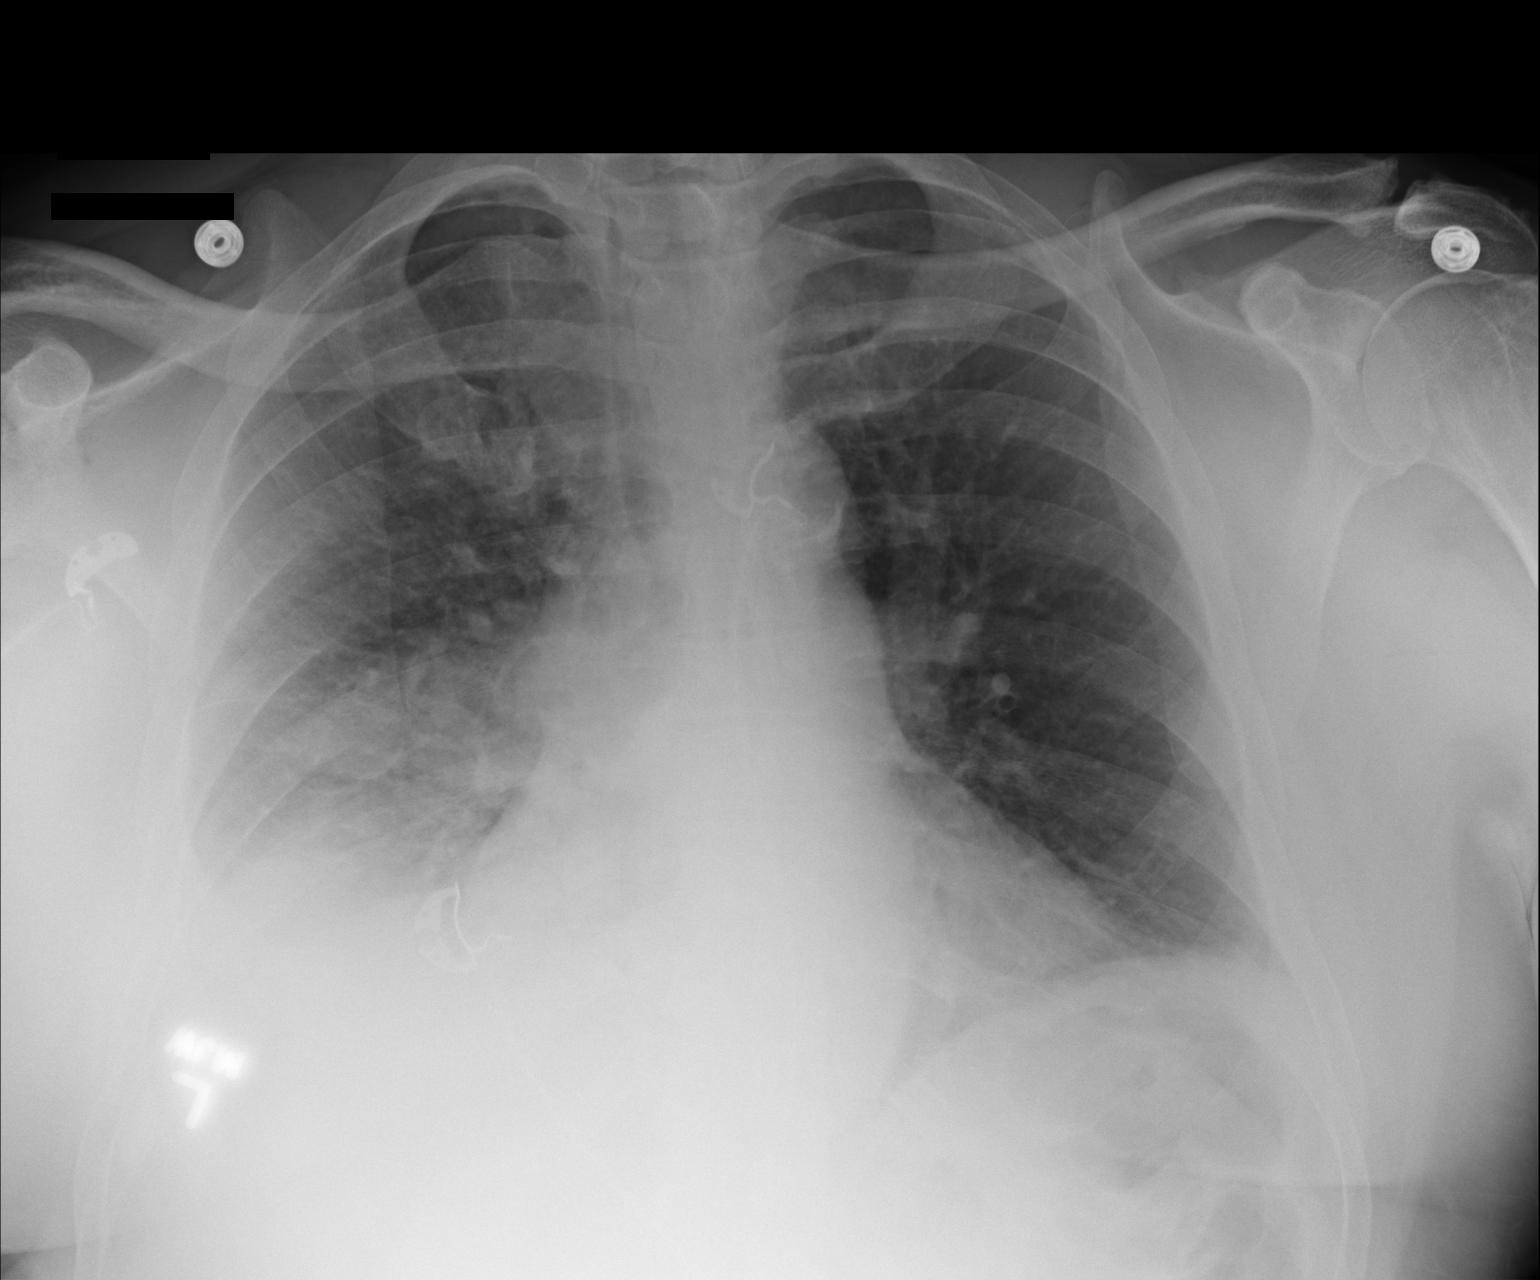

[2 of 2 positions shown; findings below may reference images not displayed]

FINDINGS: Airspace opacity increased in the right lower lobe. This could
represent atelectasis or infarct related to previously diagnosed
pulmonary emboli. Minimal left base atelectasis. Heart is borderline
in size. Small right pleural effusion.

No acute bony abnormality.
IMPRESSION: Increasing right basilar atelectasis or pulmonary infarct.

Small right pleural effusion.

## 2015-09-21 IMAGING — CR DG CHEST 2V
2 series · 2 of 2 positions shown · non-contrast
Comparison: 02/27/2014

CLINICAL DATA: Pulmonary emboli, possible infarct.

EXAM:
CHEST  2 VIEW

[w chest pa]
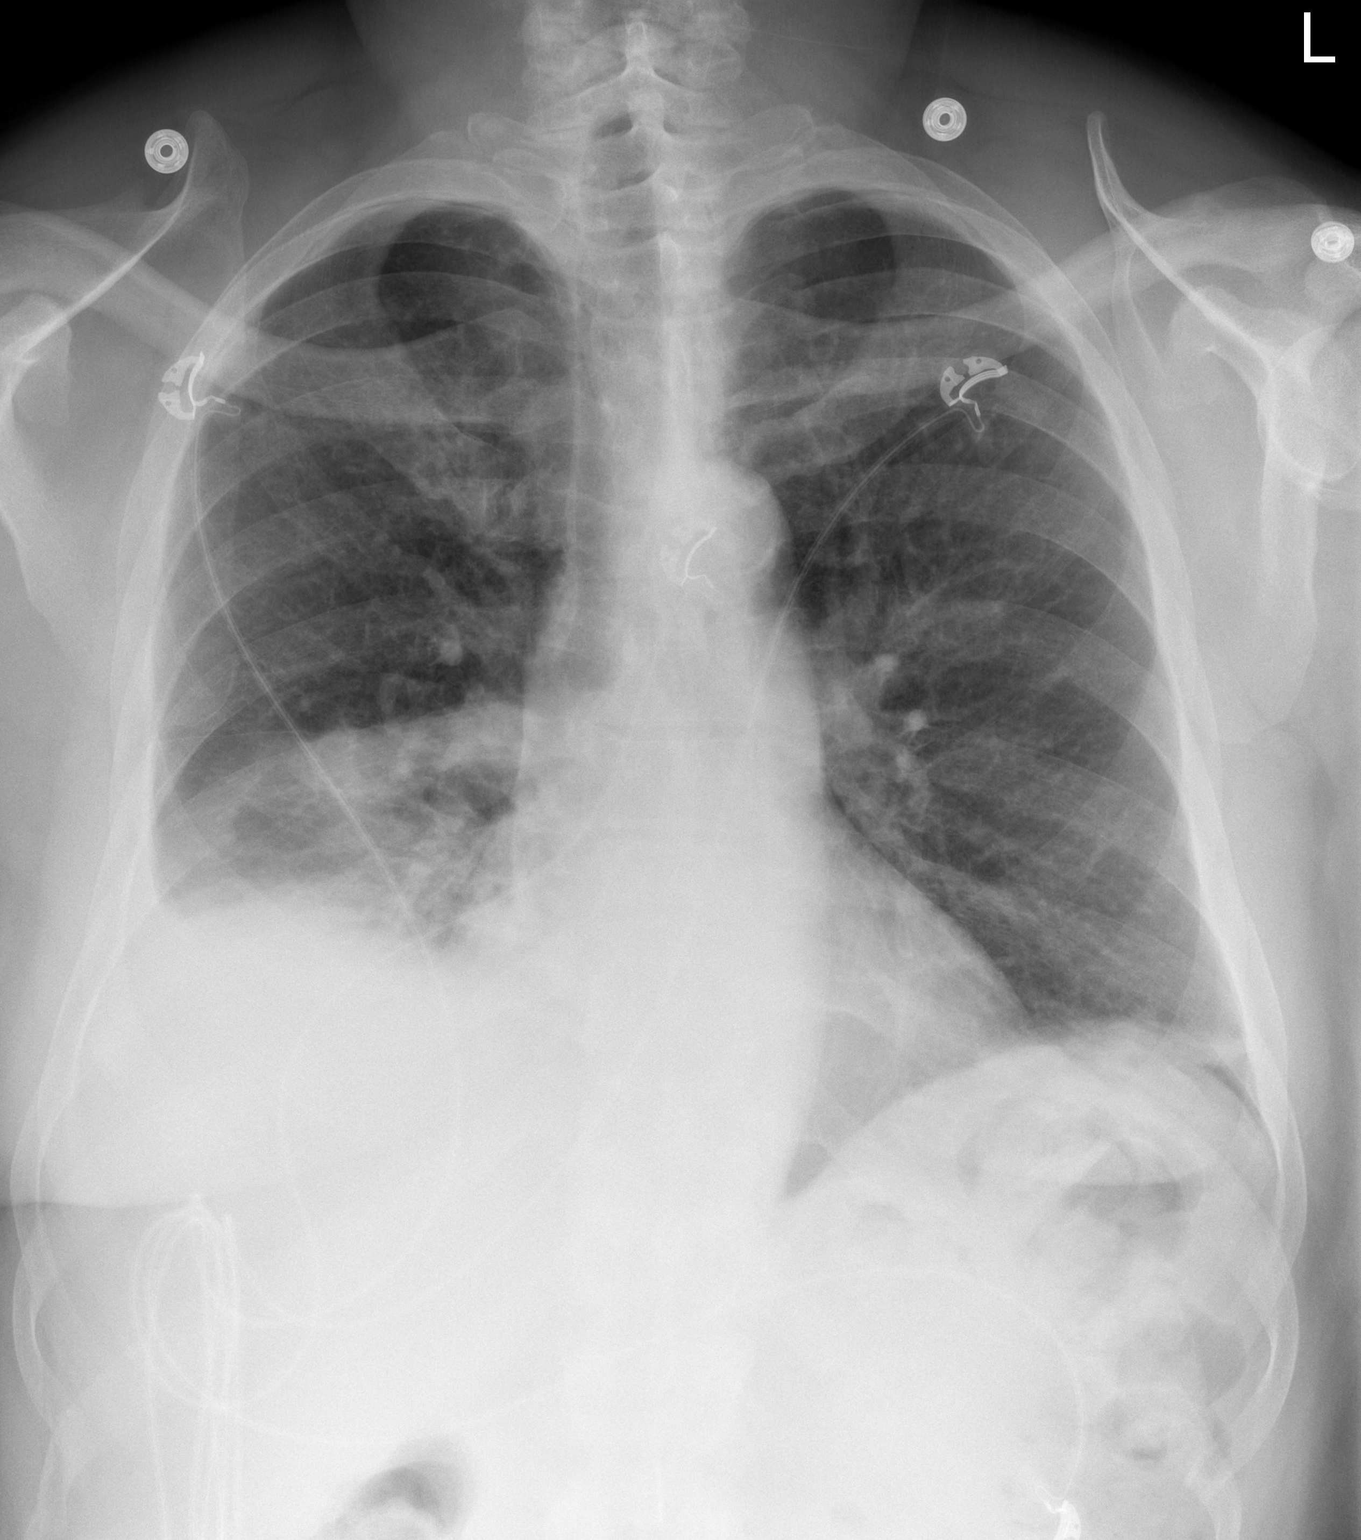

[w chest lat]
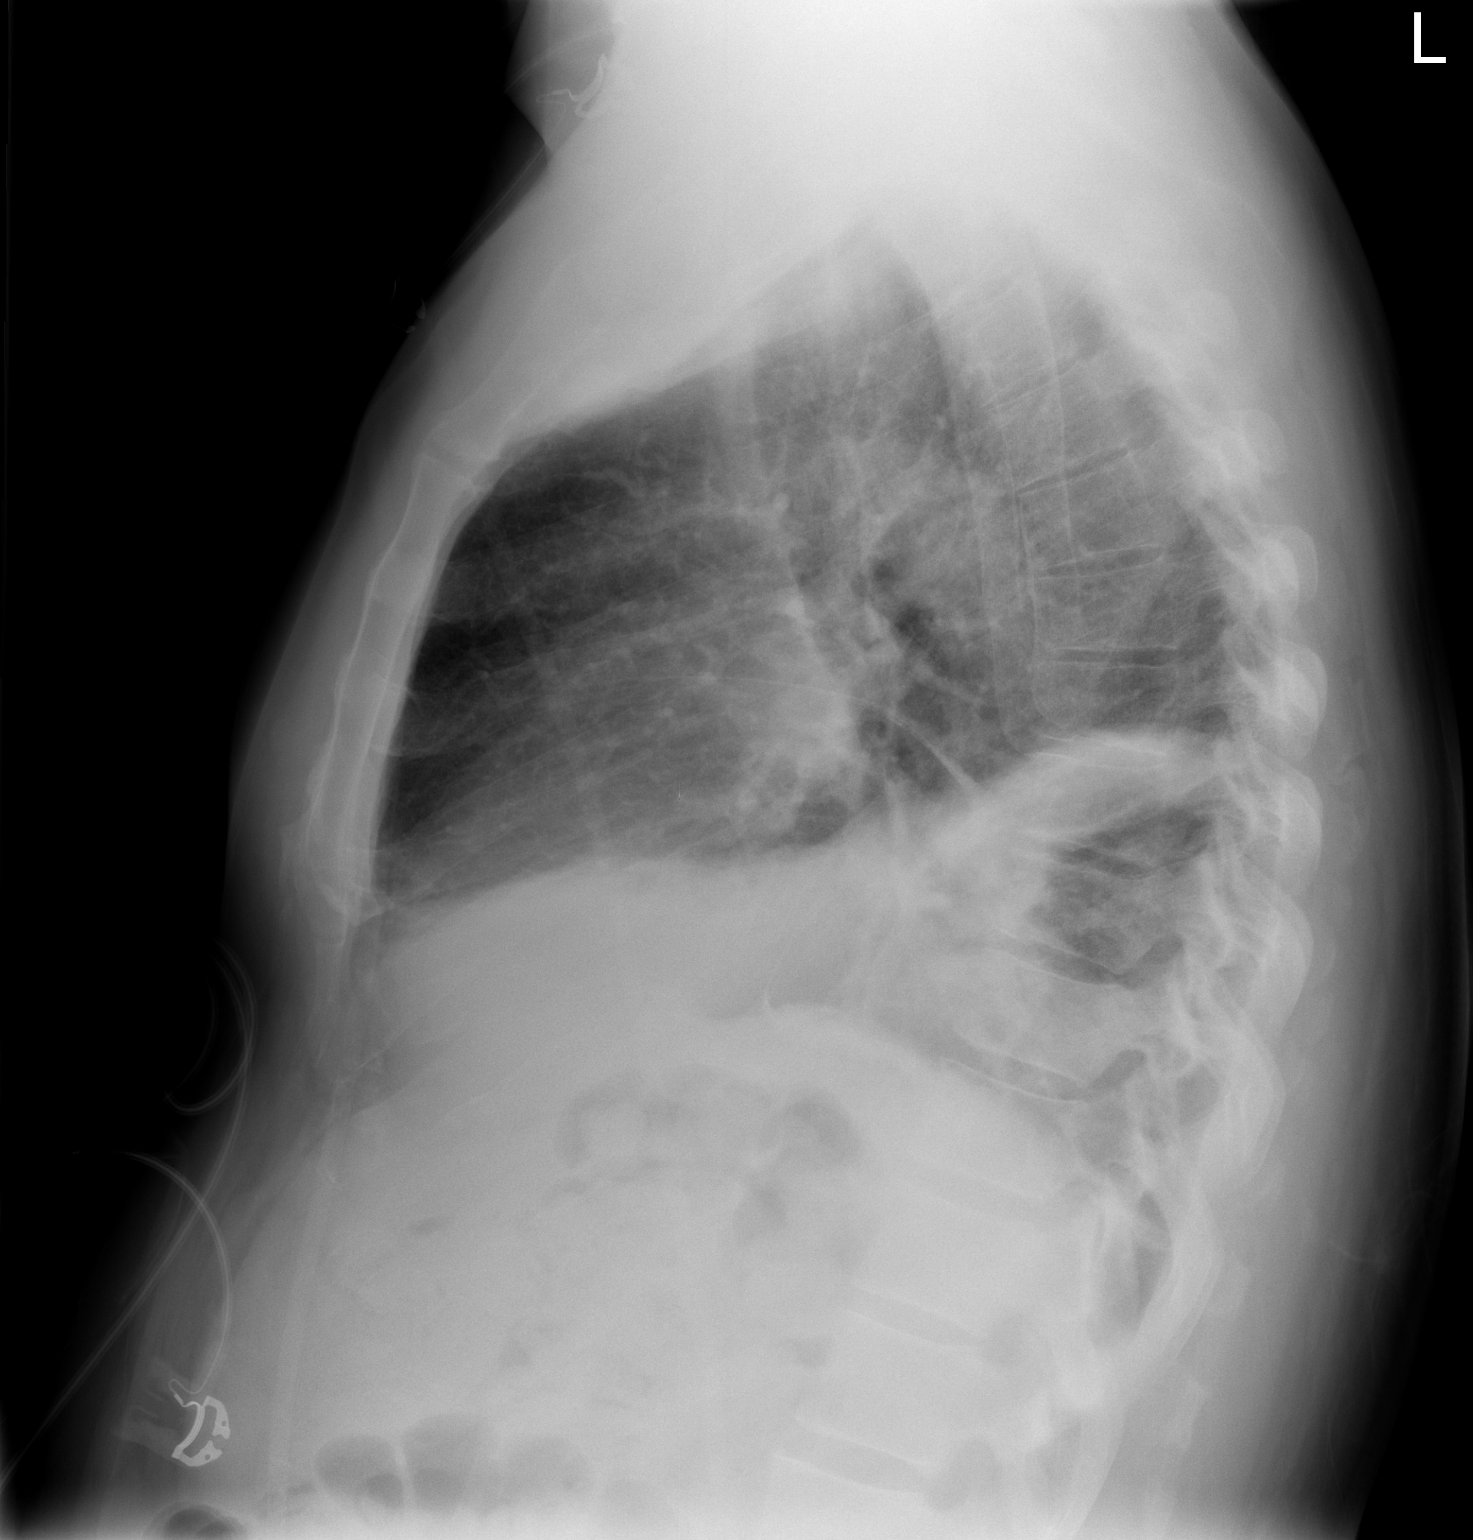

[2 of 2 positions shown; findings below may reference images not displayed]

FINDINGS: Heart size is normal. Small right pleural effusion reidentified with
obscuration of the right lung base. Superimposed right lower lobe
opacity is slightly more confluent than previously. Linear left
lower lobe probable atelectasis is noted. No pleural effusion.
IMPRESSION: Increased right lower lobe consolidation with small pleural effusion
which could represent pulmonary infarct although pneumonia or less
likely atelectasis could appear similar.

## 2015-10-22 ENCOUNTER — Ambulatory Visit: Payer: Self-pay | Admitting: Family Medicine

## 2015-11-09 ENCOUNTER — Ambulatory Visit: Payer: Self-pay | Admitting: Family Medicine

## 2016-01-24 ENCOUNTER — Ambulatory Visit: Payer: Self-pay | Attending: Internal Medicine

## 2016-02-14 ENCOUNTER — Ambulatory Visit: Payer: Self-pay | Attending: Family Medicine | Admitting: Family Medicine

## 2016-02-14 ENCOUNTER — Encounter: Payer: Self-pay | Admitting: Family Medicine

## 2016-02-14 VITALS — BP 160/76 | HR 66 | Temp 99.0°F | Resp 16 | Ht 76.0 in | Wt 282.4 lb

## 2016-02-14 DIAGNOSIS — Z720 Tobacco use: Secondary | ICD-10-CM | POA: Insufficient documentation

## 2016-02-14 DIAGNOSIS — R972 Elevated prostate specific antigen [PSA]: Secondary | ICD-10-CM | POA: Insufficient documentation

## 2016-02-14 DIAGNOSIS — I10 Essential (primary) hypertension: Secondary | ICD-10-CM | POA: Insufficient documentation

## 2016-02-14 MED ORDER — AMLODIPINE BESYLATE 10 MG PO TABS: 10.0000 mg | ORAL_TABLET | Freq: Every day | ORAL | Status: DC

## 2016-02-14 MED FILL — AMLODIPINE BESYLATE 10 MG T: 10 | 30 days supply | Qty: 30 | Fill #0

## 2016-02-14 NOTE — Progress Notes (Signed)
Pt here to establish care. Pt states he thinks he has an enlarged prostate. Pt also has questions about warfarin. Pt is not taking any medications.

## 2016-02-14 NOTE — Patient Instructions (Signed)
Hypertension Hypertension, commonly called high blood pressure, is when the force of blood pumping through your arteries is too strong. Your arteries are the blood vessels that carry blood from your heart throughout your body. A blood pressure reading consists of a higher number over a lower number, such as 110/72. The higher number (systolic) is the pressure inside your arteries when your heart pumps. The lower number (diastolic) is the pressure inside your arteries when your heart relaxes. Ideally you want your blood pressure below 120/80. Hypertension forces your heart to work harder to pump blood. Your arteries may become narrow or stiff. Having untreated or uncontrolled hypertension can cause heart attack, stroke, kidney disease, and other problems. RISK FACTORS Some risk factors for high blood pressure are controllable. Others are not.  Risk factors you cannot control include:   Race. You may be at higher risk if you are African American.  Age. Risk increases with age.  Gender. Men are at higher risk than women before age 45 years. After age 65, women are at higher risk than men. Risk factors you can control include:  Not getting enough exercise or physical activity.  Being overweight.  Getting too much fat, sugar, calories, or salt in your diet.  Drinking too much alcohol. SIGNS AND SYMPTOMS Hypertension does not usually cause signs or symptoms. Extremely high blood pressure (hypertensive crisis) may cause headache, anxiety, shortness of breath, and nosebleed. DIAGNOSIS To check if you have hypertension, your health care provider will measure your blood pressure while you are seated, with your arm held at the level of your heart. It should be measured at least twice using the same arm. Certain conditions can cause a difference in blood pressure between your right and left arms. A blood pressure reading that is higher than normal on one occasion does not mean that you need treatment. If  it is not clear whether you have high blood pressure, you may be asked to return on a different day to have your blood pressure checked again. Or, you may be asked to monitor your blood pressure at home for 1 or more weeks. TREATMENT Treating high blood pressure includes making lifestyle changes and possibly taking medicine. Living a healthy lifestyle can help lower high blood pressure. You may need to change some of your habits. Lifestyle changes may include:  Following the DASH diet. This diet is high in fruits, vegetables, and whole grains. It is low in salt, red meat, and added sugars.  Keep your sodium intake below 2,300 mg per day.  Getting at least 30-45 minutes of aerobic exercise at least 4 times per week.  Losing weight if necessary.  Not smoking.  Limiting alcoholic beverages.  Learning ways to reduce stress. Your health care provider may prescribe medicine if lifestyle changes are not enough to get your blood pressure under control, and if one of the following is true:  You are 18-59 years of age and your systolic blood pressure is above 140.  You are 60 years of age or older, and your systolic blood pressure is above 150.  Your diastolic blood pressure is above 90.  You have diabetes, and your systolic blood pressure is over 140 or your diastolic blood pressure is over 90.  You have kidney disease and your blood pressure is above 140/90.  You have heart disease and your blood pressure is above 140/90. Your personal target blood pressure may vary depending on your medical conditions, your age, and other factors. HOME CARE INSTRUCTIONS    Have your blood pressure rechecked as directed by your health care provider.   Take medicines only as directed by your health care provider. Follow the directions carefully. Blood pressure medicines must be taken as prescribed. The medicine does not work as well when you skip doses. Skipping doses also puts you at risk for  problems.  Do not smoke.   Monitor your blood pressure at home as directed by your health care provider. SEEK MEDICAL CARE IF:   You think you are having a reaction to medicines taken.  You have recurrent headaches or feel dizzy.  You have swelling in your ankles.  You have trouble with your vision. SEEK IMMEDIATE MEDICAL CARE IF:  You develop a severe headache or confusion.  You have unusual weakness, numbness, or feel faint.  You have severe chest or abdominal pain.  You vomit repeatedly.  You have trouble breathing. MAKE SURE YOU:   Understand these instructions.  Will watch your condition.  Will get help right away if you are not doing well or get worse.   This information is not intended to replace advice given to you by your health care provider. Make sure you discuss any questions you have with your health care provider.   Document Released: 08/07/2005 Document Revised: 12/22/2014 Document Reviewed: 05/30/2013 Elsevier Interactive Patient Education 2016 Elsevier Inc.  

## 2016-02-14 NOTE — Progress Notes (Signed)
Subjective:  Patient ID: Jeffery Gamble, male    DOB: 04/12/1951  Age: 65 y.o. MRN: [id number]  CC: Establish Care   HPI Jeffery Gamble is a 65 year old male with a history of PE, left lower extremity DVT diagnosed in 02/2014 who comes into the clinic to establish care. He was on Coumadin for just 3 months after which he discontinued it and never cared to follow-up. He denies a family history of blood clots and denies chest pain or lower extremity swelling. 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  His blood pressure is elevated and he informs me he previously had a blood pressure 186/95 at a health fair. He also had an elevated PSA of 17.11 for which he was referred to Urology at the time but he never went.  Outpatient Prescriptions Prior to Visit  Medication Sig Dispense Refill  . oxyCODONE-acetaminophen (PERCOCET/ROXICET) 5-325 MG per tablet Take 1-2 tablets by mouth every 6 (six) hours as needed for severe pain. (Patient not taking: Reported on 02/14/2016) 30 tablet 0  . polyethylene glycol (MIRALAX / GLYCOLAX) packet Take 17 g by mouth 2 (two) times daily. (Patient not taking: Reported on 02/14/2016) 14 each 0  . warfarin (COUMADIN) 10 MG tablet Take 10 mg daily. (Patient not taking: Reported on 02/14/2016) 30 tablet 0  . warfarin (COUMADIN) 2.5 MG tablet Take 1 tablet (2.5 mg total) by mouth daily. (Patient not taking: Reported on 02/14/2016) 30 tablet 0   No facility-administered medications prior to visit.    ROS Review of Systems  Constitutional: Negative for activity change and appetite change.  HENT: Negative for sinus pressure and sore throat.   Respiratory: Negative for chest tightness, shortness of breath and wheezing.   Cardiovascular: Negative for chest pain and palpitations.  Gastrointestinal: Negative for abdominal pain, constipation and abdominal distention.  Genitourinary: Negative.   Musculoskeletal: Negative.     Psychiatric/Behavioral: Negative for behavioral problems and dysphoric mood.    Objective:  BP 160/76 mmHg  Pulse 66  Temp(Src) 99 F (37.2 C) (Oral)  Resp 16  Ht 6\' 4"  (1.93 m)  Wt 282 lb 6.4 oz (128.096 kg)  BMI 34.39 kg/m2  SpO2 97%  BP/Weight 02/14/2016 03/06/2014 03/03/2014  Systolic BP 160 146 139  Diastolic BP 76 91 74  Wt. (Lbs) 282.4 279 274.5  BMI 34.39 33.98 -      Physical Exam  Constitutional: He is oriented to person, place, and time. He appears well-developed and well-nourished.  Obese  Cardiovascular: Normal rate, normal heart sounds and intact distal pulses.   No murmur heard. Pulmonary/Chest: Effort normal and breath sounds normal. He has no wheezes. He has no rales. He exhibits no tenderness.  Abdominal: Soft. Bowel sounds are normal. He exhibits no distension and no mass. There is no tenderness.  Musculoskeletal: Normal range of motion.  Neurological: He is alert and oriented to person, place, and time.     Assessment & Plan:   1. Essential hypertension Newly diagnosed Commenced and amlodipine Low-sodium diet and lifestyle modifications including weight loss. - amLODipine (NORVASC) 10 MG tablet; Take 1 tablet (10 mg total) by mouth daily.  Dispense: 90 tablet; Refill: 3  2. Tobacco abuse Spent 3 minutes counseling on cessation and he is working on quitting  3. Elevated PSA Previously elevated PSA for which he was referred to Urology and never went Will order PSA again and refer to Urology if indicated. - PSA  Meds ordered this encounter  Medications  . amLODipine (NORVASC) 10 MG tablet    Sig: Take 1 tablet (10 mg total) by mouth daily.    Dispense:  90 tablet    Refill:  3    Follow-up: Return in about 1 month (around 03/15/2016) for follow up on hypertension.   Jaclyn ShaggyEnobong Amao MD

## 2016-02-15 ENCOUNTER — Telehealth: Payer: Self-pay

## 2016-02-15 ENCOUNTER — Other Ambulatory Visit: Payer: Self-pay | Admitting: Family Medicine

## 2016-02-15 DIAGNOSIS — R972 Elevated prostate specific antigen [PSA]: Secondary | ICD-10-CM

## 2016-02-15 LAB — PSA: PSA: 29.41 ng/mL — ABNORMAL HIGH (ref <=4.00)

## 2016-02-15 NOTE — Telephone Encounter (Signed)
-----   Message from Jaclyn ShaggyEnobong Amao, MD sent at 02/15/2016  2:36 PM EDT ----- Please inform him his PSA is severely elevated and close to double the value one year ago. I have referred him to urology for [facility] to [facility] through.

## 2016-02-15 NOTE — Telephone Encounter (Signed)
Writer called patient and LVM requesting patient to call back for lab results.

## 2016-02-18 ENCOUNTER — Telehealth: Payer: Self-pay

## 2016-02-18 NOTE — Telephone Encounter (Signed)
Writer left second VM requesting patient to call back to obtain lab results.

## 2016-02-18 NOTE — Telephone Encounter (Signed)
-----   Message from Enobong Amao, MD sent at 02/15/2016  2:36 PM EDT ----- Please inform him his PSA is severely elevated and close to double the value one year ago. I have referred him to urology for he will need the [facility] discount for referral to go through. 

## 2016-03-06 ENCOUNTER — Telehealth: Payer: Self-pay | Admitting: Family Medicine

## 2016-03-06 NOTE — Telephone Encounter (Signed)
Pt. Returned call. Pt. Received a letter to return call. Please f/u

## 2016-03-07 NOTE — Telephone Encounter (Signed)
  Return to call to patient.  Patient information verified per [employer] guidelines.  RN advised patient per Dr. Venetia NightAmao: Please inform him his PSA is severely elevated and close to double the value one year ago. I have referred him to urology for [facility] to go through. Patient verbalized understanding.

## 2016-03-28 ENCOUNTER — Telehealth: Payer: Self-pay | Admitting: Family Medicine

## 2016-03-28 DIAGNOSIS — I10 Essential (primary) hypertension: Secondary | ICD-10-CM

## 2016-03-28 MED ORDER — AMLODIPINE BESYLATE 10 MG PO TABS: 10.0000 mg | ORAL_TABLET | Freq: Every day | ORAL | 3 refills | Status: AC

## 2016-03-28 NOTE — Addendum Note (Signed)
Addended by: Juanita CraverKARL, STACEY A on: 03/28/2016 02:23 PM   Modules accepted: Orders

## 2016-03-28 NOTE — Telephone Encounter (Signed)
Pt called requesting medication refill on amLODipine (NORVASC) 10 MG tablet, please f/up pt states he is completely out.

## 2016-03-28 NOTE — Telephone Encounter (Signed)
Refilled amlodipine 

## 2016-07-03 ENCOUNTER — Telehealth: Payer: Self-pay | Admitting: Family Medicine

## 2016-07-03 NOTE — Telephone Encounter (Signed)
Pt. Called requesting to know the status of his Urology referral. Please f/u with pt.

## 2016-07-06 NOTE — Telephone Encounter (Signed)
The Referral was sent to [facility] (orange card ) they sent it to [facility] for review and no answer yet waiting for Stacy wright from [facility]  to contact me .

## 2017-01-31 ENCOUNTER — Ambulatory Visit: Payer: Self-pay | Admitting: Physician Assistant

## 2017-06-04 ENCOUNTER — Encounter (HOSPITAL_COMMUNITY): Payer: Self-pay | Admitting: *Deleted

## 2017-06-04 ENCOUNTER — Emergency Department (HOSPITAL_BASED_OUTPATIENT_CLINIC_OR_DEPARTMENT_OTHER)
Admit: 2017-06-04 | Discharge: 2017-06-04 | Disposition: A | Discharge status: Home / Self Care | Payer: Self-pay | Attending: Emergency Medicine | Admitting: Emergency Medicine

## 2017-06-04 ENCOUNTER — Emergency Department (HOSPITAL_COMMUNITY)
Admission: EM | Admit: 2017-06-04 | Discharge: 2017-06-04 | Disposition: A | Discharge status: Home / Self Care | Payer: Self-pay | Attending: Emergency Medicine | Admitting: Emergency Medicine

## 2017-06-04 DIAGNOSIS — I10 Essential (primary) hypertension: Secondary | ICD-10-CM | POA: Insufficient documentation

## 2017-06-04 DIAGNOSIS — M79609 Pain in unspecified limb: Secondary | ICD-10-CM

## 2017-06-04 DIAGNOSIS — Z79899 Other long term (current) drug therapy: Secondary | ICD-10-CM | POA: Insufficient documentation

## 2017-06-04 DIAGNOSIS — F17210 Nicotine dependence, cigarettes, uncomplicated: Secondary | ICD-10-CM | POA: Insufficient documentation

## 2017-06-04 DIAGNOSIS — N401 Enlarged prostate with lower urinary tract symptoms: Secondary | ICD-10-CM | POA: Insufficient documentation

## 2017-06-04 LAB — URINALYSIS, ROUTINE W REFLEX MICROSCOPIC
Bilirubin Urine: NEGATIVE
Glucose, UA: NEGATIVE mg/dL
Hgb urine dipstick: NEGATIVE
Ketones, ur: NEGATIVE mg/dL
Leukocytes, UA: NEGATIVE
Nitrite: NEGATIVE
Protein, ur: NEGATIVE mg/dL
Specific Gravity, Urine: 1.011 (ref 1.005–1.030)
pH: 6 (ref 5.0–8.0)

## 2017-06-04 MED ORDER — TAMSULOSIN HCL 0.4 MG PO CAPS: 0.4000 mg | ORAL_CAPSULE | Freq: Every day | ORAL | 1 refills | Status: AC

## 2017-06-04 MED FILL — TAMSULOSIN HCL 0.4 MG CAP: 0.4 | 30 days supply | Qty: 30 | Fill #0 | Status: TO

## 2017-06-04 NOTE — ED Triage Notes (Signed)
Pt in c/o dysuria, urinary frequency and sensation of not emptying bladder ongoing x 6 mths worsening this weekend, denies fever & chills, A&O x4

## 2017-06-04 NOTE — ED Provider Notes (Signed)
[facility] EMERGENCY DEPARTMENT Provider Note   CSN: [id number] Arrival date & time: 06/04/17  1048     History   Chief Complaint No chief complaint on file.   HPI Jeffery Gamble is a 66 y.o. male.  HPI   66 year old male with increased urinary frequency and urgency. Feeling that is not quite into his bladder completely. Has to force of stream. Ongoing for months. No hematuria. No abdominal pain. Review of systems, he does endorse some atraumatic left lower extremity swelling. 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 He has no respiratory complaints. He is a Naval architect. 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  History reviewed. No pertinent past medical history.  Patient Active Problem List   Diagnosis Date Noted  . Essential hypertension 02/14/2016  . Tobacco abuse 02/14/2016  . Elevated PSA 02/14/2016  . Leukocytosis, unspecified 02/26/2014  . Fever, unspecified 02/26/2014  . DVT, femoral, acute (HCC) 02/26/2014  . PE (pulmonary embolism) 02/25/2014    History reviewed. No pertinent surgical history.     Home Medications    Prior to Admission medications   Medication Sig Start Date End Date Taking? Authorizing Provider  amLODipine (NORVASC) 10 MG tablet Take 1 tablet (10 mg total) by mouth daily. 03/28/16   Jaclyn Shaggy, MD    Family History No family history on file.  Social History Social History  Substance Use Topics  . Smoking status: Current Every Day Smoker    Packs/day: 1.00    Types: Cigarettes  . Smokeless tobacco: Never Used  . Alcohol use 14.4 oz/week    24 Cans of beer per week     Allergies   Patient has no known allergies.   Review of Systems Review of Systems  All systems reviewed and negative, other than as noted in HPI.  Physical Exam Updated Vital Signs BP (!) 171/92 (BP Location: Right  Arm)   Pulse (!) 56   Temp 98.3 F (36.8 C) (Oral)   Resp 14   SpO2 98%   Physical Exam  Constitutional: He appears well-developed and well-nourished. No distress.  HENT:  Head: Normocephalic and atraumatic.  Eyes: Conjunctivae are normal. Right eye exhibits no discharge. Left eye exhibits no discharge.  Neck: Neck supple.  Cardiovascular: Normal rate, regular rhythm and normal heart sounds.  Exam reveals no gallop and no friction rub.   No murmur heard. Pulmonary/Chest: Effort normal and breath sounds normal. No respiratory distress.  Abdominal: Soft. He exhibits no distension. There is no tenderness.  Musculoskeletal: He exhibits edema. He exhibits no tenderness.  Left lower exam knee swelling. No calf tenderness. Palpable DP pulse.  Neurological: He is alert.  Skin: Skin is warm and dry.  Psychiatric: He has a normal mood and affect. His behavior is normal. Thought content normal.  Nursing note and vitals reviewed.    ED Treatments / Results  Labs (all labs ordered are listed, but only abnormal results are displayed) Labs Reviewed  URINALYSIS, ROUTINE W REFLEX MICROSCOPIC    EKG  EKG Interpretation None       Radiology No results found.  Procedures Procedures (including critical care time)  Medications Ordered in ED Medications - No data to display   Initial Impression / Assessment and Plan / ED Course  I have reviewed the triage vital signs and the nursing notes.  Pertinent labs &  imaging results that were available during my care of the patient were reviewed by me and considered in my medical decision making (see chart for details).     65yM with urinary urgency, frequency, nocturia, straining. Symptoms likely from BPH. UA normal. No PCP. Will start on flomax. Needs to establish outpt care. LLE swelling with hx of DVT and works as Naval architect. This was negative. No signs of infection. Minimal pain. Great DP pulse. Keep elevated. Return precautions  discussed. Outpt FU otherwise.   Final Clinical Impressions(s) / ED Diagnoses   Final diagnoses:  Benign prostatic hyperplasia with lower urinary tract symptoms, symptom details unspecified    New Prescriptions New Prescriptions   No medications on file     Raeford Razor, MD 06/06/17 1156

## 2017-06-04 NOTE — ED Notes (Signed)
Pt verbalized understanding of d/c instructions and has no further questions. NAD, VSS. Removed all belongings from room.

## 2017-06-04 NOTE — Progress Notes (Signed)
Left lower ext venous duplex:  Preliminary results- No evidence of DVT or baker's cyst.   Farrel Demark, RDMS, RVT 06/04/2017

## 2019-04-21 ENCOUNTER — Emergency Department (HOSPITAL_COMMUNITY): Payer: Medicare Other

## 2019-04-21 ENCOUNTER — Encounter (HOSPITAL_COMMUNITY): Payer: Self-pay | Admitting: Emergency Medicine

## 2019-04-21 ENCOUNTER — Emergency Department (HOSPITAL_COMMUNITY)
Admission: EM | Admit: 2019-04-21 | Discharge: 2019-04-21 | Disposition: A | Discharge status: Home / Self Care | Payer: Medicare Other | Attending: Emergency Medicine | Admitting: Emergency Medicine

## 2019-04-21 ENCOUNTER — Other Ambulatory Visit: Payer: Self-pay

## 2019-04-21 DIAGNOSIS — F17210 Nicotine dependence, cigarettes, uncomplicated: Secondary | ICD-10-CM | POA: Insufficient documentation

## 2019-04-21 DIAGNOSIS — R0602 Shortness of breath: Secondary | ICD-10-CM | POA: Insufficient documentation

## 2019-04-21 DIAGNOSIS — R079 Chest pain, unspecified: Secondary | ICD-10-CM | POA: Diagnosis not present

## 2019-04-21 DIAGNOSIS — Z79899 Other long term (current) drug therapy: Secondary | ICD-10-CM | POA: Diagnosis not present

## 2019-04-21 DIAGNOSIS — I10 Essential (primary) hypertension: Secondary | ICD-10-CM | POA: Insufficient documentation

## 2019-04-21 DIAGNOSIS — R1011 Right upper quadrant pain: Secondary | ICD-10-CM | POA: Diagnosis present

## 2019-04-21 DIAGNOSIS — N1330 Unspecified hydronephrosis: Secondary | ICD-10-CM

## 2019-04-21 LAB — CBC
HCT: 42.5 % (ref 39.0–52.0)
Hemoglobin: 14.1 g/dL (ref 13.0–17.0)
MCH: 32.1 pg (ref 26.0–34.0)
MCHC: 33.2 g/dL (ref 30.0–36.0)
MCV: 96.8 fL (ref 80.0–100.0)
Platelets: 296 10*3/uL (ref 150–400)
RBC: 4.39 MIL/uL (ref 4.22–5.81)
RDW: 11.4 % — ABNORMAL LOW (ref 11.5–15.5)
WBC: 8.3 10*3/uL (ref 4.0–10.5)
nRBC: 0 % (ref 0.0–0.2)

## 2019-04-21 LAB — URINALYSIS, ROUTINE W REFLEX MICROSCOPIC
Bilirubin Urine: NEGATIVE
Glucose, UA: NEGATIVE mg/dL
Hgb urine dipstick: NEGATIVE
Ketones, ur: NEGATIVE mg/dL
Leukocytes,Ua: NEGATIVE
Nitrite: NEGATIVE
Protein, ur: NEGATIVE mg/dL
Specific Gravity, Urine: 1.006 (ref 1.005–1.030)
pH: 6 (ref 5.0–8.0)

## 2019-04-21 LAB — COMPREHENSIVE METABOLIC PANEL WITH GFR
ALT: 21 U/L (ref 0–44)
AST: 16 U/L (ref 15–41)
Albumin: 3.7 g/dL (ref 3.5–5.0)
Alkaline Phosphatase: 71 U/L (ref 38–126)
Anion gap: 12 (ref 5–15)
BUN: 15 mg/dL (ref 8–23)
CO2: 23 mmol/L (ref 22–32)
Calcium: 9.3 mg/dL (ref 8.9–10.3)
Chloride: 103 mmol/L (ref 98–111)
Creatinine, Ser: 1.64 mg/dL — ABNORMAL HIGH (ref 0.61–1.24)
GFR calc Af Amer: 50 mL/min — ABNORMAL LOW
GFR calc non Af Amer: 43 mL/min — ABNORMAL LOW
Glucose, Bld: 112 mg/dL — ABNORMAL HIGH (ref 70–99)
Potassium: 4 mmol/L (ref 3.5–5.1)
Sodium: 138 mmol/L (ref 135–145)
Total Bilirubin: 0.7 mg/dL (ref 0.3–1.2)
Total Protein: 7.3 g/dL (ref 6.5–8.1)

## 2019-04-21 LAB — LIPASE, BLOOD: Lipase: 49 U/L (ref 11–51)

## 2019-04-21 LAB — TROPONIN I (HIGH SENSITIVITY)
Troponin I (High Sensitivity): 5 ng/L
Troponin I (High Sensitivity): 6 ng/L

## 2019-04-21 LAB — D-DIMER, QUANTITATIVE: D-Dimer, Quant: 0.79 ug{FEU}/mL — ABNORMAL HIGH (ref 0.00–0.50)

## 2019-04-21 MED ORDER — IOHEXOL 350 MG/ML SOLN
75.0000 mL | Freq: Once | INTRAVENOUS | Status: AC | PRN
  Administered 2019-04-21: 75 mL via INTRAVENOUS

## 2019-04-21 MED ORDER — SODIUM CHLORIDE 0.9 % IV BOLUS
1000.0000 mL | Freq: Once | INTRAVENOUS | Status: AC
  Administered 2019-04-21: 13:00:00 1000 mL via INTRAVENOUS

## 2019-04-21 NOTE — ED Notes (Signed)
Pt resting comfortably, states pain is all located RUQ abd, rates it a "4".  Denies need for IV at this time.  Will wait and do V for additional labs.

## 2019-04-21 NOTE — ED Provider Notes (Signed)
[facility] EMERGENCY DEPARTMENT Provider Note   CSN: [id number] Arrival date & time: 04/21/19  0865     History   Chief Complaint Chief Complaint  Patient presents with   Abdominal Pain    Upper right side    HPI Jeffery Gamble is a 68 y.o. male with PMHx HTN and hx DVT/PE who presents to the ED today planing of gradual onset, constant, waxing and waning, achy, right upper quadrant and right-sided chest pain x2 weeks.  Also complains of some shortness of breath.  He reports history of DVT and PE in 2015 for which she was on Coumadin for 1 year.  States that with this pandemic he has been furloughed and has been more immobile lately.  Endorses lying on the couch frequently.  Smokes about 1 pack/day.  He denies any history of chest pain or family history.  Not been taking anything for the pain.  Endorses diet of frequent fatty meals.  Denies fever, chills, nausea, vomiting, diarrhea, constipation, melena, hematochezia, urinary symptoms, leg swelling, any other associated symptoms.      History reviewed. No pertinent past medical history.  Patient Active Problem List   Diagnosis Date Noted   Essential hypertension 02/14/2016   Tobacco abuse 02/14/2016   Elevated PSA 02/14/2016   Leukocytosis, unspecified 02/26/2014   Fever, unspecified 02/26/2014   DVT, femoral, acute ([place]) 02/26/2014   PE (pulmonary embolism) 02/25/2014    History reviewed. No pertinent surgical history.      Home Medications    Prior to Admission medications   Medication Sig Start Date End Date Taking? Authorizing Provider  amLODipine (NORVASC) 10 MG tablet Take 1 tablet (10 mg total) by mouth daily. 03/28/16  Yes Charlott Rakes, MD  tamsulosin (FLOMAX) 0.4 MG CAPS capsule Take 1 capsule (0.4 mg total) by mouth daily after supper. Patient taking differently: Take 0.4 mg by mouth daily.  06/04/17  Yes Virgel Manifold, MD    Family History No family history on file.  Social  History Social History   Tobacco Use   Smoking status: Current Every Day Smoker    Packs/day: 1.00    Types: Cigarettes   Smokeless tobacco: Never Used  Substance Use Topics   Alcohol use: Yes    Alcohol/week: 24.0 standard drinks    Types: 24 Cans of beer per week   Drug use: No     Allergies   Patient has no known allergies.   Review of Systems Review of Systems  Constitutional: Negative for chills and fever.  HENT: Negative for congestion.   Eyes: Negative for visual disturbance.  Respiratory: Positive for shortness of breath.   Cardiovascular: Positive for chest pain. Negative for palpitations and leg swelling.  Gastrointestinal: Positive for abdominal pain. Negative for blood in stool, constipation, diarrhea, nausea and vomiting.  Genitourinary: Negative for difficulty urinating.  Musculoskeletal: Negative for myalgias.  Skin: Negative for rash.  Neurological: Negative for syncope and headaches.     Physical Exam Updated Vital Signs BP (!) 143/80    Pulse 80    Temp 98.5 F (36.9 C) (Oral)    Resp 16    Ht 6\' 4"  (1.93 m)    Wt 108 kg    SpO2 100%    BMI 28.97 kg/m   Physical Exam Vitals signs and nursing note reviewed.  Constitutional:      Appearance: He is not ill-appearing or diaphoretic.  HENT:     Head: Normocephalic and atraumatic.  Eyes:  Conjunctiva/sclera: Conjunctivae normal.  Neck:     Musculoskeletal: Neck supple.  Cardiovascular:     Rate and Rhythm: Normal rate and regular rhythm.     Heart sounds: Normal heart sounds.  Pulmonary:     Effort: Pulmonary effort is normal.     Breath sounds: Normal breath sounds. No wheezing, rhonchi or rales.     Comments: Right lateral chest wall tenderness to palpation. Chest:     Chest wall: Tenderness present.  Abdominal:     Palpations: Abdomen is soft.     Tenderness: There is abdominal tenderness in the right upper quadrant.     Comments: Soft, TTP to LUQ, epigstrium, and RUQ with +  Murphyh's sign. +BS throughout, no r/g/r, neg mcburney's, no CVA TTP   Skin:    General: Skin is warm and dry.  Neurological:     Mental Status: He is alert.      ED Treatments / Results  Labs (all labs ordered are listed, but only abnormal results are displayed) Labs Reviewed  COMPREHENSIVE METABOLIC PANEL - Abnormal; Notable for the following components:      Result Value   Glucose, Bld 112 (*)    Creatinine, Ser 1.64 (*)    GFR calc non Af Amer 43 (*)    GFR calc Af Amer 50 (*)    All other components within normal limits  CBC - Abnormal; Notable for the following components:   RDW 11.4 (*)    All other components within normal limits  D-DIMER, QUANTITATIVE (NOT AT [facility]) - Abnormal; Notable for the following components:   D-Dimer, Quant 0.79 (*)    All other components within normal limits  LIPASE, BLOOD  URINALYSIS, ROUTINE W REFLEX MICROSCOPIC  TROPONIN I (HIGH SENSITIVITY)  TROPONIN I (HIGH SENSITIVITY)    EKG None  Radiology Ct Angio Chest Pe W/cm &/or Wo Cm  Result Date: 04/21/2019 CLINICAL DATA:  PE suspected, history of PE 2015 EXAM: CT ANGIOGRAPHY CHEST WITH CONTRAST TECHNIQUE: Multidetector CT imaging of the chest was performed using the standard protocol during bolus administration of intravenous contrast. Multiplanar CT image reconstructions and MIPs were obtained to evaluate the vascular anatomy. CONTRAST:  75mL OMNIPAQUE IOHEXOL 350 MG/ML SOLN COMPARISON:  02/24/2014 FINDINGS: Cardiovascular: Satisfactory opacification of the pulmonary arteries to the segmental level. No evidence of acute pulmonary embolism. There is pruning of the dependent right lower lobe pulmonary arteries, in keeping with chronic sequelae of prior occlusive embolus in the posterior segmental vessels seen on examination dated 02/25/2014. Normal heart size. Scattered left coronary artery calcifications. No pericardial effusion. Mild aortic atherosclerosis. Mediastinum/Nodes: No enlarged  mediastinal, hilar, or axillary lymph nodes. Thyroid gland, trachea, and esophagus demonstrate no significant findings. Lungs/Pleura: Minimal irregular scarring of the dependent right lung base. Mild, diffuse bilateral bronchial wall thickening. No pleural effusion or pneumothorax. Upper Abdomen: Severe bilateral hydronephrosis, partially imaged. Musculoskeletal: No chest wall abnormality. No acute or significant osseous findings. Review of the MIP images confirms the above findings. IMPRESSION: 1.  Negative examination for acute pulmonary embolism. 2. There is pruning of the dependent right lower lobe pulmonary arteries, in keeping with chronic sequelae of prior occlusive embolus in the posterior segmental vessels seen on examination dated 02/25/2014. 3. Minimal irregular scarring of the dependent right lung base, likely sequelae of prior pulmonary embolism. 4. Mild, diffuse bilateral bronchial wall thickening, consistent with nonspecific infectious or inflammatory bronchitis. 5.  Coronary artery disease. 6. Severe bilateral hydronephrosis, partially imaged. Please see separately dictated forthcoming CT  examination of the abdomen and pelvis. Electronically Signed   By: Lauralyn Primes M.D.   On: 04/21/2019 16:41   Dg Chest Port 1 View  Result Date: 04/21/2019 CLINICAL DATA:  Chest pain short of breath. Right upper quadrant pain EXAM: PORTABLE CHEST 1 VIEW COMPARISON:  03/02/2014 FINDINGS: Heart size upper normal. Negative for heart failure. No infiltrate edema or effusion. Atherosclerotic aortic arch. IMPRESSION: No active disease. Electronically Signed   By: Marlan Palau M.D.   On: 04/21/2019 12:21   Ct Renal Stone Study  Result Date: 04/21/2019 CLINICAL DATA:  Flank pain with stone disease suspected. EXAM: CT ABDOMEN AND PELVIS WITHOUT CONTRAST TECHNIQUE: Multidetector CT imaging of the abdomen and pelvis was performed following the standard protocol without IV contrast. COMPARISON:  None. FINDINGS: Lower  chest: The lung bases are clear. The heart size is normal. Hepatobiliary: The liver is normal. Normal gallbladder.There is no biliary ductal dilation. Pancreas: Normal contours without ductal dilatation. No peripancreatic fluid collection. Spleen: No splenic laceration or hematoma. Adrenals/Urinary Tract: --Adrenal glands: No adrenal hemorrhage. --Right kidney/ureter: There is moderate severe right-sided hydroureteronephrosis to the level of the urinary bladder. --Left kidney/ureter: There is moderate severe left-sided hydroureteronephrosis to the level of the urinary bladder. --Urinary bladder: The urinary bladder is distended. The bladder wall is thickened and trabeculated. Stomach/Bowel: --Stomach/Duodenum: No hiatal hernia or other gastric abnormality. Normal duodenal course and caliber. --Small bowel: No dilatation or inflammation. --Colon: No focal abnormality. --Appendix: Normal. Vascular/Lymphatic: Atherosclerotic calcification is present within the non-aneurysmal abdominal aorta, without hemodynamically significant stenosis. --No retroperitoneal lymphadenopathy. --No mesenteric lymphadenopathy. --No pelvic or inguinal lymphadenopathy. Reproductive: The prostate gland is severely enlarged. Other: No ascites or free air. The abdominal wall is normal. Musculoskeletal. No acute displaced fractures. IMPRESSION: 1. Moderate to severe bilateral hydroureteronephrosis to the level of the urinary bladder. Overall findings appear to be secondary to a chronic outlet obstruction as evidence by a dilated urinary bladder with bladder wall trabeculation in combination with a severely enlarged prostate gland. The patient may benefit from Foley catheter decompression. 2. Additional chronic findings as detailed above. Electronically Signed   By: Katherine Mantle M.D.   On: 04/21/2019 16:29   US Abdomen Limited Ruq  Result Date: 04/21/2019 CLINICAL DATA:  Right upper quadrant abdominal pain. EXAM: ULTRASOUND ABDOMEN  LIMITED RIGHT UPPER QUADRANT COMPARISON:  None. FINDINGS: Gallbladder: No gallstones or wall thickening visualized. No sonographic Murphy sign noted by sonographer. Common bile duct: Diameter: 8 mm distally with mild intrahepatic dilatation. Correlation with liver function tests is recommended to rule out distal common bile duct obstruction. Liver: No focal lesion identified. Within normal limits in parenchymal echogenicity. Portal vein is patent on color Doppler imaging with normal direction of blood flow towards the liver. Other: Moderate right hydronephrosis is noted. IMPRESSION: Moderate right hydronephrosis is noted concerning for distal ureteral obstruction. CT urogram is recommended for further evaluation. Mild intrahepatic and extrahepatic biliary dilatation is noted; correlation with liver function tests is recommended to rule out distal common bile duct obstruction. Electronically Signed   By: Lupita Raider M.D.   On: 04/21/2019 13:37    Procedures Procedures (including critical care time)  Medications Ordered in ED Medications  sodium chloride 0.9 % bolus 1,000 mL (0 mLs Intravenous Stopped 04/21/19 1350)  iohexol (OMNIPAQUE) 350 MG/ML injection 75 mL (75 mLs Intravenous Contrast Given 04/21/19 1613)     Initial Impression / Assessment and Plan / ED Course  I have reviewed the triage vital signs and the  nursing notes.  Pertinent labs & imaging results that were available during my care of the patient were reviewed by me and considered in my medical decision making (see chart for details).  Clinical Course as of Apr 20 1699  Mon Apr 21, 2019  1314 D-Dimer, Quant(!): 0.79 [MV]    Clinical Course User Index [MV] Tanda RockersVenter, Tarynn Garling, [place]-C   68 year old male who presents today with complaint of shortness of breath, right upper quadrant abdominal pain, right-sided chest wall pain x2 weeks.  He does have a history of DVT and PE in 2015.  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  He states he has been furloughed recently and has been more immobile.  Also endorses frequent fatty meals.  He is an active smoker with 40-pack-year history.  Concern for recurrent PE versus ACS versus gallbladder issues.  Obtain baseline screening labs today including CBC, CMP, lipase, troponin, d-dimer.  Chest x-ray and right upper quadrant ultrasound also ordered at this time.  If d-dimer elevated will need to obtain CTA.  Without any leg swelling or pain.   D-dimer elevated at 0.79.  Will proceed with CTA.  Initial troponin of 5.  Awaiting repeat.  X-ray clear.  No leukocytosis today.  Electrolytes within normal limits.  Anion mildly elevated at 1.64 will give IV fluids.  Right upper quadrant ultrasound with findings of right hydronephrosis.  They recommend CT renal stone study to evaluate for stones.  We will add this onto CTA of chest.  Further evaluation patient does not have any right CVA tenderness.  Question if this is kidney stone.  CTA negative for PE.  CT renal stone study does show bilateral hydroureteronephrosis. Concern for chronic outlet obstruction.  Patient states he is aware that he has an enlarged prostate and is currently on Flomax.  Radiologist recommended Foley cath decompression.  Discussed this with patient.  He states that he would rather just follow-up with urology.  He is not having any issues with his stream.  Feel that this is appropriate.  Patient will be given name of [facility] with.  Awaiting repeat Trope at this time.  4:58 PM At shift change case signed out to Langston MaskerKaren Sofia, PA-C who will dispo patient accordingly after repeat trop.          Final Clinical Impressions(s) / ED Diagnoses   Final diagnoses:  RUQ abdominal pain  Chest pain, unspecified type  Hydronephrosis, unspecified hydronephrosis type    ED Discharge Orders    None       Tanda RockersVenter, Baleigh Rennaker, PA-C 04/21/19 1700    Milagros Lollykstra, Richard S,  MD 04/22/19 [phone]

## 2019-04-21 NOTE — ED Provider Notes (Signed)
Pt's care assumed at 5pm.  Pt awaiting ct angio and troponin.   Ct no pe.  Troponin negative x 2.  Pt given discharge instructions for follow up.    Fransico Meadow, [place] 04/21/19 Solomon, MD 04/22/19 1949

## 2019-04-21 NOTE — ED Notes (Signed)
Pt c/o that he wants to eat and drink.   Reviewed that we will wait until his tests are complete.

## 2019-04-21 NOTE — Discharge Instructions (Addendum)
You were seen in the ED today for pain to your right side. Your labwork was very reassuring today. We obtained a CT scan of your chest which did not show any signs of blood clots. Your CT scan of your kidneys did show some fluid in your kidneys with concern that something is blocking the flow. Please follow up with urology regarding this.   Please follow up with your PCP regarding your ED visit today as well.

## 2019-04-21 NOTE — ED Triage Notes (Signed)
Pt reports 2 weeks of upper right abd pain.

## 2020-11-09 IMAGING — CT CT RENAL STONE PROTOCOL
2 of 4 series · 16 of 46 positions shown, 18 images · non-contrast
Comparison: None.

CLINICAL DATA: Flank pain with stone disease suspected.

EXAM:
CT ABDOMEN AND PELVIS WITHOUT CONTRAST
TECHNIQUE: Multidetector CT imaging of the abdomen and pelvis was performed
following the standard protocol without IV contrast.

[Series 3: stone study 5.0 i30f 2 · axial · 0.87mm/px · z∈[+882,+1297]mm · 13 of 91 slices shown, 15 images]
[im 4/91  soft-tissue]
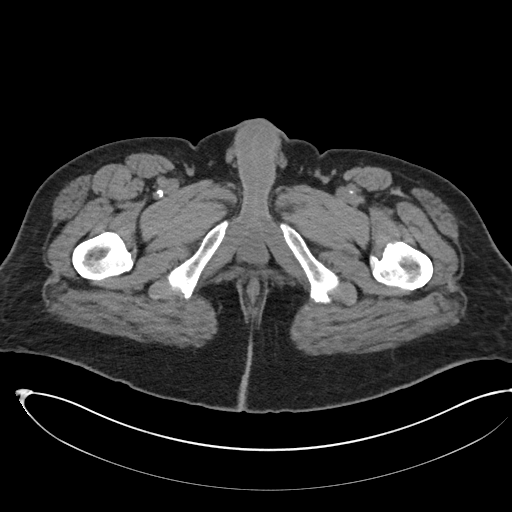
[im 4/91  bone]
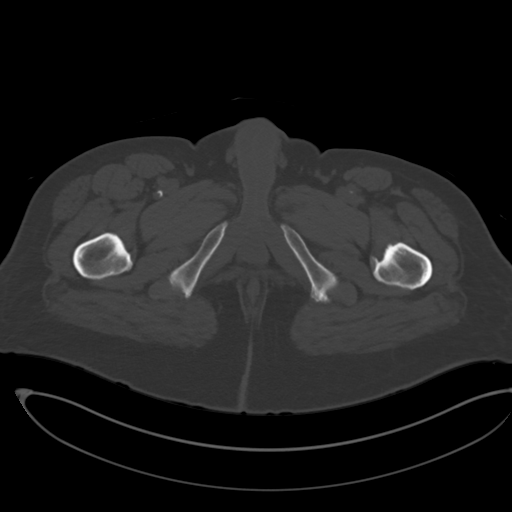
[im 12/91  soft-tissue]
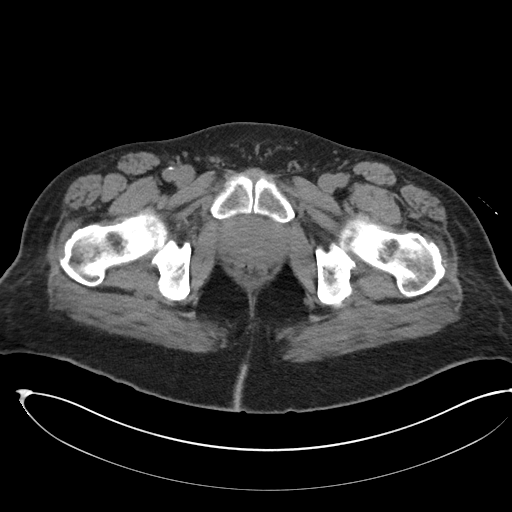
[im 19/91  soft-tissue]
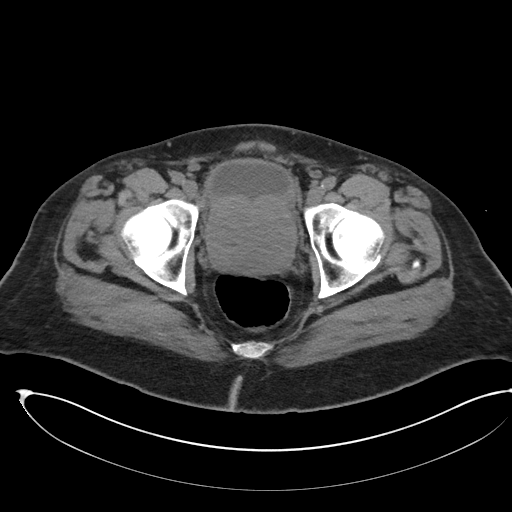
[im 27/91  soft-tissue]
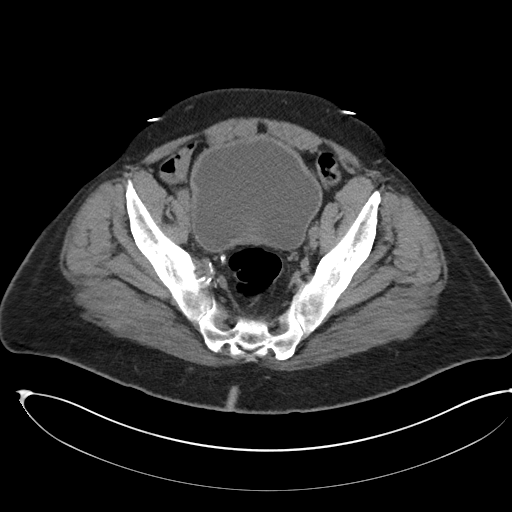
[im 31/91  soft-tissue]
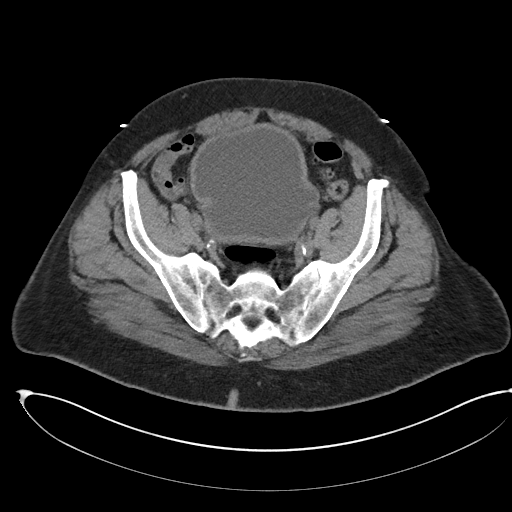
[im 38/91  soft-tissue]
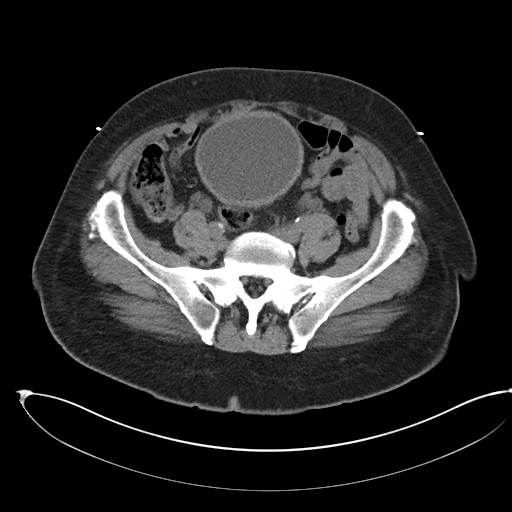
[im 46/91  soft-tissue]
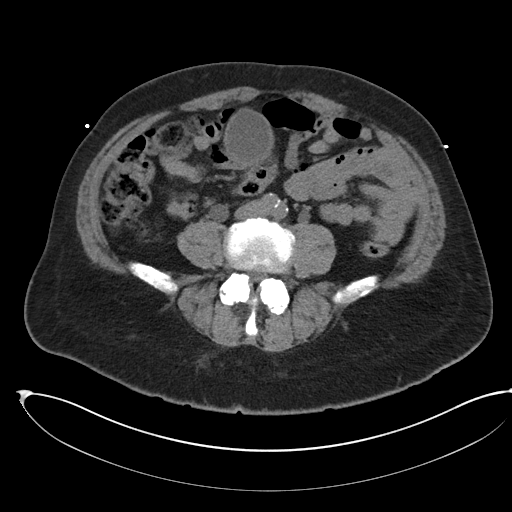
[im 53/91  soft-tissue]
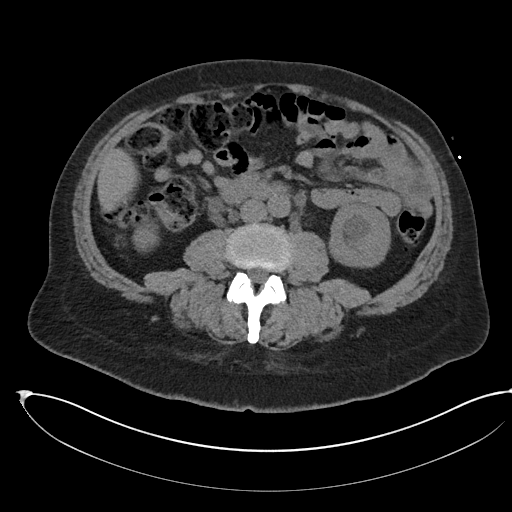
[im 61/91  soft-tissue]
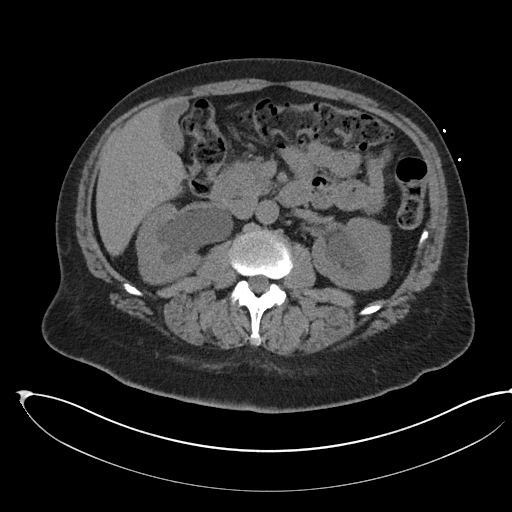
[im 61/91  bone]
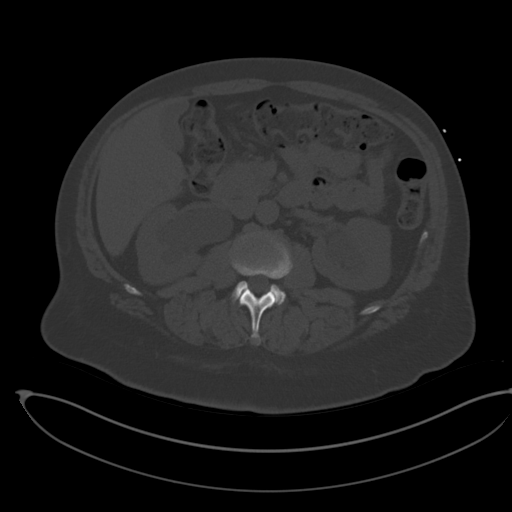
[im 64/91  soft-tissue]
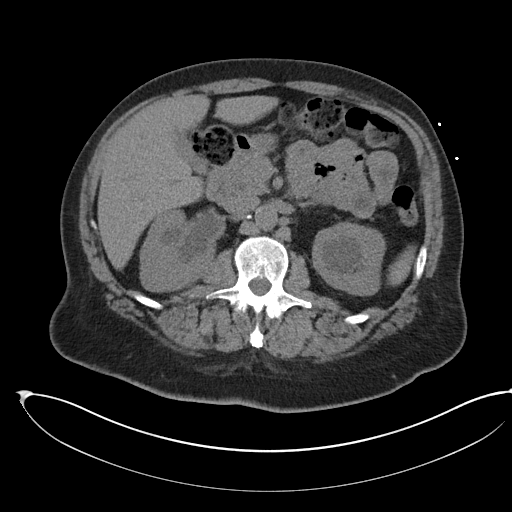
[im 72/91  soft-tissue]
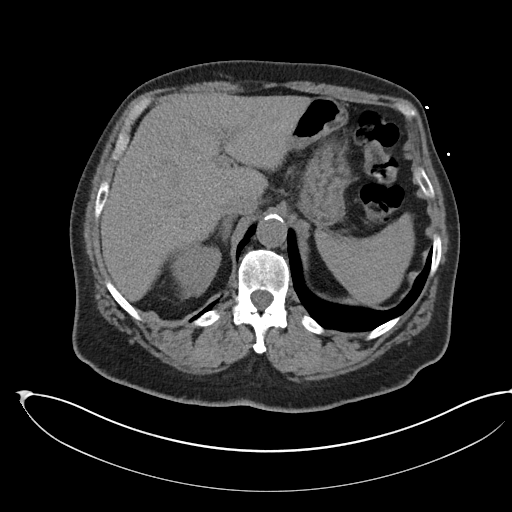
[im 79/91  soft-tissue]
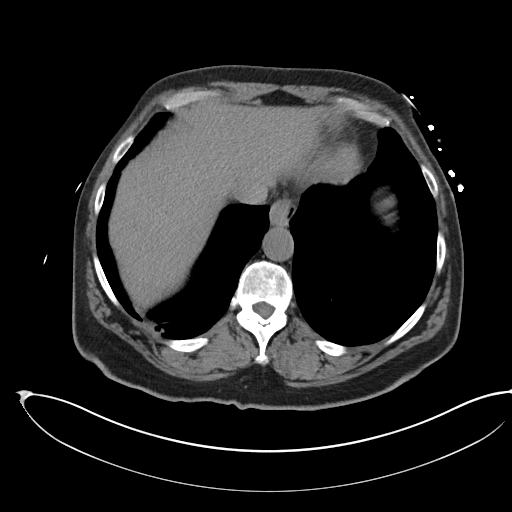
[im 87/91  soft-tissue]
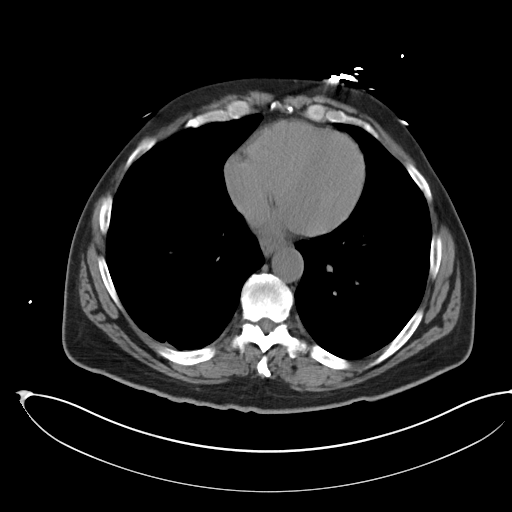

[Series 6: coronal soft tissue · coronal · 0.89mm/px · 3 of 101 slices shown]
[im 34/101  soft-tissue]
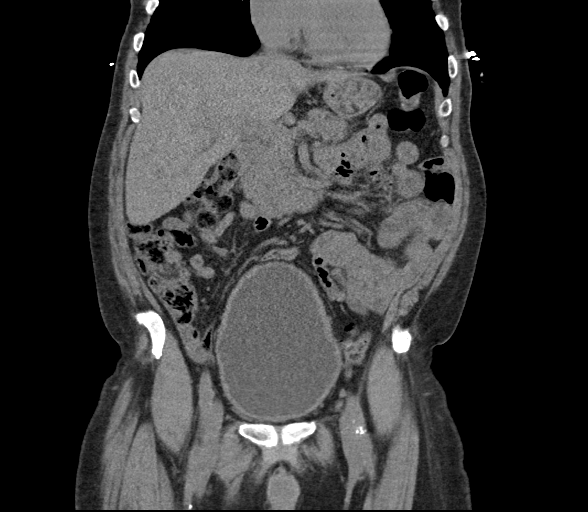
[im 45/101  soft-tissue]
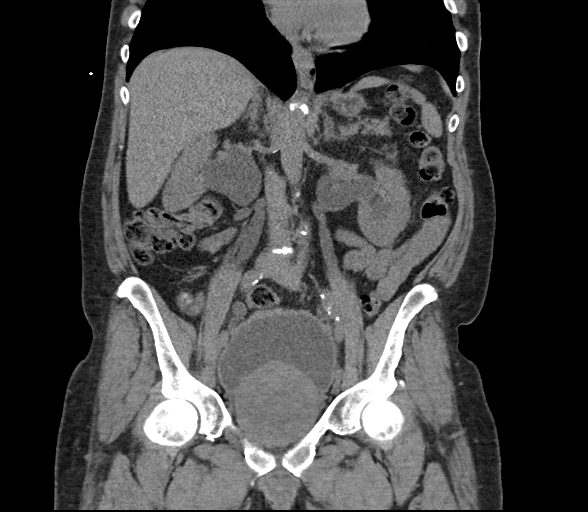
[im 56/101  soft-tissue]
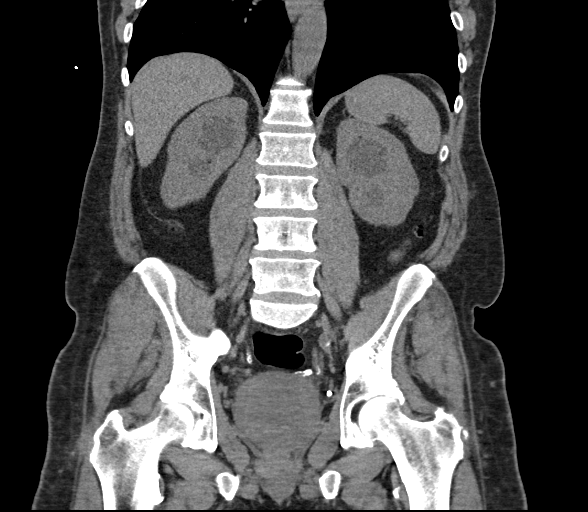

[16 of 46 positions shown; findings below may reference images not displayed]

FINDINGS: Lower chest: The lung bases are clear. The heart size is normal.

Hepatobiliary: The liver is normal. Normal gallbladder.There is no
biliary ductal dilation.

Pancreas: Normal contours without ductal dilatation. No
peripancreatic fluid collection.

Spleen: No splenic laceration or hematoma.

Adrenals/Urinary Tract:

--Adrenal glands: No adrenal hemorrhage.

--Right kidney/ureter: There is moderate severe right-sided
hydroureteronephrosis to the level of the urinary bladder.

--Left kidney/ureter: There is moderate severe left-sided
hydroureteronephrosis to the level of the urinary bladder.

--Urinary bladder: The urinary bladder is distended. The bladder
wall is thickened and trabeculated.

Stomach/Bowel:

--Stomach/Duodenum: No hiatal hernia or other gastric abnormality.
Normal duodenal course and caliber.

--Small bowel: No dilatation or inflammation.

--Colon: No focal abnormality.

--Appendix: Normal.

Vascular/Lymphatic: Atherosclerotic calcification is present within
the non-aneurysmal abdominal aorta, without hemodynamically
significant stenosis.

--No retroperitoneal lymphadenopathy.

--No mesenteric lymphadenopathy.

--No pelvic or inguinal lymphadenopathy.

Reproductive: The prostate gland is severely enlarged.

Other: No ascites or free air. The abdominal wall is normal.

Musculoskeletal. No acute displaced fractures.
IMPRESSION: 1. Moderate to severe bilateral hydroureteronephrosis to the level
of the urinary bladder. Overall findings appear to be secondary to a
chronic outlet obstruction as evidence by a dilated urinary bladder
with bladder wall trabeculation in combination with a severely
enlarged prostate gland. The patient may benefit from Foley catheter
decompression.
2. Additional chronic findings as detailed above.

## 2021-06-15 ENCOUNTER — Encounter (HOSPITAL_COMMUNITY): Payer: Self-pay

## 2021-06-15 ENCOUNTER — Emergency Department (HOSPITAL_COMMUNITY): Admit: 2021-06-15 | Payer: 59

## 2021-06-15 ENCOUNTER — Emergency Department (HOSPITAL_COMMUNITY)
Admission: EM | Admit: 2021-06-15 | Discharge: 2021-06-15 | Disposition: A | Discharge status: Home / Self Care | Payer: 59 | Attending: Emergency Medicine | Admitting: Emergency Medicine

## 2021-06-15 DIAGNOSIS — Z5321 Procedure and treatment not carried out due to patient leaving prior to being seen by health care provider: Secondary | ICD-10-CM | POA: Diagnosis not present

## 2021-06-15 DIAGNOSIS — R319 Hematuria, unspecified: Secondary | ICD-10-CM | POA: Insufficient documentation

## 2021-06-15 LAB — URINALYSIS, ROUTINE W REFLEX MICROSCOPIC
Bilirubin Urine: NEGATIVE
Glucose, UA: NEGATIVE mg/dL
Ketones, ur: NEGATIVE mg/dL
Nitrite: NEGATIVE
Protein, ur: NEGATIVE mg/dL
Specific Gravity, Urine: 1.009 (ref 1.005–1.030)
pH: 5 (ref 5.0–8.0)

## 2021-06-15 LAB — CBC
HCT: 36.6 % — ABNORMAL LOW (ref 39.0–52.0)
Hemoglobin: 11.9 g/dL — ABNORMAL LOW (ref 13.0–17.0)
MCH: 31.6 pg (ref 26.0–34.0)
MCHC: 32.5 g/dL (ref 30.0–36.0)
MCV: 97.3 fL (ref 80.0–100.0)
Platelets: 261 10*3/uL (ref 150–400)
RBC: 3.76 MIL/uL — ABNORMAL LOW (ref 4.22–5.81)
RDW: 12.4 % (ref 11.5–15.5)
WBC: 6.8 10*3/uL (ref 4.0–10.5)
nRBC: 0 % (ref 0.0–0.2)

## 2021-06-15 LAB — COMPREHENSIVE METABOLIC PANEL
ALT: 8 U/L (ref 0–44)
AST: 11 U/L — ABNORMAL LOW (ref 15–41)
Albumin: 3.6 g/dL (ref 3.5–5.0)
Alkaline Phosphatase: 71 U/L (ref 38–126)
Anion gap: 6 (ref 5–15)
BUN: 27 mg/dL — ABNORMAL HIGH (ref 8–23)
CO2: 24 mmol/L (ref 22–32)
Calcium: 8.9 mg/dL (ref 8.9–10.3)
Chloride: 113 mmol/L — ABNORMAL HIGH (ref 98–111)
Creatinine, Ser: 2.52 mg/dL — ABNORMAL HIGH (ref 0.61–1.24)
GFR, Estimated: 27 mL/min — ABNORMAL LOW (ref >60)
Glucose, Bld: 67 mg/dL — ABNORMAL LOW (ref 70–99)
Potassium: 4 mmol/L (ref 3.5–5.1)
Sodium: 143 mmol/L (ref 135–145)
Total Bilirubin: 0.4 mg/dL (ref 0.3–1.2)
Total Protein: 7 g/dL (ref 6.5–8.1)

## 2021-06-15 NOTE — ED Provider Notes (Signed)
Emergency Medicine Provider Triage Evaluation Note  Jeffery Gamble , a 70 y.o. male  was evaluated in triage.  Pt complains of 2 episodes of hematuria over the past 5 days.  Patient reports he has a history of an enlarged prostate and takes tamsulosin.  He denies any trouble urinating or problems with his urine stream.  Denies any abdominal pain.  Denies any fever.  Denies any dysuria or urgency/frequency. He reports he was sent here for referral for urology.  Review of Systems  Positive: Hematuria Negative: Dysuria, urinary urgency, urinary frequency, fever, abdominal pain  Physical Exam  BP (!) 157/80 (BP Location: Left Arm)   Pulse (!) 50   Temp 98.1 F (36.7 C) (Oral)   Resp 16   SpO2 100%  Gen:   Awake, no distress   Resp:  Normal effort  MSK:   Moves extremities without difficulty  Other:  GU exam deferred  Medical Decision Making  Medically screening exam initiated at 8:06 AM.  Appropriate orders placed.  Zubair Lofton was informed that the remainder of the evaluation will be completed by another provider, this initial triage assessment does not replace that evaluation, and the importance of remaining in the ED until their evaluation is complete.  Labs ordered   Achille Rich, Cordelia Poche 06/15/21 4332    Gwyneth Sprout, MD 06/15/21 1534

## 2021-06-15 NOTE — ED Triage Notes (Signed)
Pt arrived via POV, c/o blood in urine since Sunday. Denies any pain with urination or any other sx.

## 2022-10-09 ENCOUNTER — Emergency Department (HOSPITAL_COMMUNITY): Payer: 59

## 2022-10-09 ENCOUNTER — Emergency Department (HOSPITAL_COMMUNITY)
Admission: EM | Admit: 2022-10-09 | Discharge: 2022-10-09 | Disposition: A | Discharge status: Home / Self Care | Payer: 59 | Attending: Emergency Medicine | Admitting: Emergency Medicine

## 2022-10-09 ENCOUNTER — Other Ambulatory Visit: Payer: Self-pay

## 2022-10-09 ENCOUNTER — Encounter (HOSPITAL_COMMUNITY): Payer: Self-pay

## 2022-10-09 DIAGNOSIS — N3001 Acute cystitis with hematuria: Secondary | ICD-10-CM | POA: Diagnosis not present

## 2022-10-09 DIAGNOSIS — R310 Gross hematuria: Secondary | ICD-10-CM | POA: Diagnosis present

## 2022-10-09 DIAGNOSIS — R7989 Other specified abnormal findings of blood chemistry: Secondary | ICD-10-CM | POA: Diagnosis not present

## 2022-10-09 DIAGNOSIS — Z79899 Other long term (current) drug therapy: Secondary | ICD-10-CM | POA: Diagnosis not present

## 2022-10-09 LAB — BASIC METABOLIC PANEL
Anion gap: 8 (ref 5–15)
BUN: 23 mg/dL (ref 8–23)
CO2: 22 mmol/L (ref 22–32)
Calcium: 8.3 mg/dL — ABNORMAL LOW (ref 8.9–10.3)
Chloride: 110 mmol/L (ref 98–111)
Creatinine, Ser: 2.42 mg/dL — ABNORMAL HIGH (ref 0.61–1.24)
GFR, Estimated: 28 mL/min — ABNORMAL LOW (ref >60)
Glucose, Bld: 95 mg/dL (ref 70–99)
Potassium: 4.3 mmol/L (ref 3.5–5.1)
Sodium: 140 mmol/L (ref 135–145)

## 2022-10-09 LAB — CBC
HCT: 35.7 % — ABNORMAL LOW (ref 39.0–52.0)
Hemoglobin: 11.4 g/dL — ABNORMAL LOW (ref 13.0–17.0)
MCH: 30.4 pg (ref 26.0–34.0)
MCHC: 31.9 g/dL (ref 30.0–36.0)
MCV: 95.2 fL (ref 80.0–100.0)
Platelets: 231 10*3/uL (ref 150–400)
RBC: 3.75 MIL/uL — ABNORMAL LOW (ref 4.22–5.81)
RDW: 12.4 % (ref 11.5–15.5)
WBC: 7.2 10*3/uL (ref 4.0–10.5)
nRBC: 0 % (ref 0.0–0.2)

## 2022-10-09 LAB — URINALYSIS, W/ REFLEX TO CULTURE (INFECTION SUSPECTED)
Bilirubin Urine: NEGATIVE
Glucose, UA: NEGATIVE mg/dL
Ketones, ur: NEGATIVE mg/dL
Leukocytes,Ua: NEGATIVE
Nitrite: NEGATIVE
Protein, ur: 30 mg/dL — AB
RBC / HPF: >50 RBC/hpf (ref 0–5)
Specific Gravity, Urine: 1.008 (ref 1.005–1.030)
WBC, UA: >50 WBC/hpf (ref 0–5)
pH: 6 (ref 5.0–8.0)

## 2022-10-09 MED ORDER — SODIUM CHLORIDE 0.9 % IV SOLN
1.0000 g | Freq: Once | INTRAVENOUS | Status: AC
  Administered 2022-10-09: 1 g via INTRAVENOUS
  Filled 2022-10-09: qty 10

## 2022-10-09 MED ORDER — CEFPODOXIME PROXETIL 200 MG PO TABS: 200.0000 mg | ORAL_TABLET | Freq: Two times a day (BID) | ORAL | 0 refills | Status: DC

## 2022-10-09 NOTE — ED Triage Notes (Signed)
Patient said he noted blood in his urine yesterday with small clots. No painful urination. No blood thinners.

## 2022-10-09 NOTE — ED Provider Notes (Signed)
3:54 PM Assumed care of patient from off-going team. For more details, please see note from same day.  In brief, this is a 72 y.o. male  with gross hematuria. Has already been seen by PCP but continued to have hematuria so came to ED. NO abdominal pain or dysuria.   Plan/Dispo at time of sign-out & ED Course since sign-out: [ ]$  CT abd pelvis w/o contrast [ ]$  home w/ abx [ ]$  urology f/u   BP (!) 152/84   Pulse 61   Temp 98.5 F (36.9 C) (Oral)   Resp 19   Ht 6' 4"$  (1.93 m)   Wt 111.6 kg   SpO2 98%   BMI 29.94 kg/m    ED Course:   Clinical Course as of 10/09/22 1749  Mon Oct 09, 2022  [id number] Basic metabolic panel(!) Creatinine is elevated 2.42 [JK]  1529 CBC(!)  anemia noted, hemoglobin stable [JK]  1529 Urinalysis, w/ Reflex to Culture (Infection Suspected) -Urine, Clean Catch(!) Urinalysis suggestive of possible infection [JK]  1748 Patient elects to leave prior to his CT scan.  He states that he has a CT scan scheduled in the next couple of days and would rather not wait here now.  UA shows possible UTI.  Urine culture is in process and will treat with cefpodoxime twice daily x 10 days.  Patient is already been given a dose of ceftriaxone here.  I discussed with patient that he should have a CT scan but I do not believe it is emergent given that he has a benign abdominal exam, afebrile, no leukocytosis.  Hemoglobin is stable.  I believe patient is stable for discharge.  Discharged with discharge instructions and return precautions, all questions answered to patient satisfaction.  [HN]    Clinical Course User Index [HN] Audley Hose, MD [JK] Dorie Rank, MD    Dispo: Given [facility] info and instructed to call to make f/u appt ------------------------------- Cindee Lame, MD Emergency Medicine  This note was created using dictation software, which may contain spelling or grammatical errors.   Audley Hose, MD 10/09/22 1750

## 2022-10-09 NOTE — ED Notes (Signed)
Pt resting on stretcher NAD. Educated on need for clean catch urine, pt agreeable to plan. Given a urinal and wipes.

## 2022-10-09 NOTE — ED Notes (Signed)
PIV placed. Pt on sp02 and nibp, call bell in reach. Bloodwork and urine collected and sent to lab.

## 2022-10-09 NOTE — ED Provider Notes (Signed)
[facility] Provider Note   CSN: [id number] Arrival date & time: 10/09/22  1346     History {Add pertinent medical, surgical, social history, OB history to HPI:1} Chief Complaint  Patient presents with   Hematuria    Jeffery Gamble is a 72 y.o. male.   Hematuria     Patient presented to the ED for evaluation of hematuria.  Patient states he has noticed symptoms for the last few days.  He went to his doctor's office and had laboratory testing.  Patient states they ordered a CT scan but it is scheduled for next week.  Patient went home after that and had another episode of bloody urine and passed some clots.  Patient became concerned so he came to the ED.  He denies any weight loss.  No fevers.  Home Medications Prior to Admission medications   Medication Sig Start Date End Date Taking? Authorizing Provider  amLODipine (NORVASC) 10 MG tablet Take 1 tablet (10 mg total) by mouth daily. 03/28/16   Charlott Rakes, MD  tamsulosin (FLOMAX) 0.4 MG CAPS capsule Take 1 capsule (0.4 mg total) by mouth daily after supper. Patient taking differently: Take 0.4 mg by mouth daily.  06/04/17   Virgel Manifold, MD      Allergies    Patient has no known allergies.    Review of Systems   Review of Systems  Genitourinary:  Positive for hematuria.    Physical Exam Updated Vital Signs BP (!) 178/108   Pulse 78   Temp 98.5 F (36.9 C) (Oral)   Resp 19   Ht 1.93 m (6' 4"$ )   Wt 111.6 kg   SpO2 100%   BMI 29.94 kg/m  Physical Exam Vitals and nursing note reviewed.  Constitutional:      Appearance: He is well-developed. He is not diaphoretic.  HENT:     Head: Normocephalic and atraumatic.     Right Ear: External ear normal.     Left Ear: External ear normal.  Eyes:     General: No scleral icterus.       Right eye: No discharge.        Left eye: No discharge.     Conjunctiva/sclera: Conjunctivae normal.  Neck:     Trachea: No tracheal  deviation.  Cardiovascular:     Rate and Rhythm: Normal rate and regular rhythm.  Pulmonary:     Effort: Pulmonary effort is normal. No respiratory distress.     Breath sounds: Normal breath sounds. No stridor. No wheezing or rales.  Abdominal:     General: Bowel sounds are normal. There is no distension.     Palpations: Abdomen is soft.     Tenderness: There is no abdominal tenderness. There is no guarding or rebound.  Genitourinary:    Comments: Urine is grossly bloody, no clots noted Musculoskeletal:        General: No tenderness or deformity.     Cervical back: Neck supple.  Skin:    General: Skin is warm and dry.     Findings: No rash.  Neurological:     General: No focal deficit present.     Mental Status: He is alert.     Cranial Nerves: No cranial nerve deficit, dysarthria or facial asymmetry.     Sensory: No sensory deficit.     Motor: No abnormal muscle tone or seizure activity.     Coordination: Coordination normal.  Psychiatric:  Mood and Affect: Mood normal.     ED Results / Procedures / Treatments   Labs (all labs ordered are listed, but only abnormal results are displayed) Labs Reviewed  CBC  BASIC METABOLIC PANEL  URINALYSIS, W/ REFLEX TO CULTURE (INFECTION SUSPECTED)    EKG None  Radiology No results found.  Procedures Procedures  {Document cardiac monitor, telemetry assessment procedure when appropriate:1}  Medications Ordered in ED Medications - No data to display  ED Course/ Medical Decision Making/ A&P   {   Click here for ABCD2, HEART and other calculatorsREFRESH Note before signing :1}                          Medical Decision Making Amount and/or Complexity of Data Reviewed Labs: ordered.   ***  {Document critical care time when appropriate:1} {Document review of labs and clinical decision tools ie heart score, Chads2Vasc2 etc:1}  {Document your independent review of radiology images, and any outside records:1} {Document  your discussion with family members, caretakers, and with consultants:1} {Document social determinants of health affecting pt's care:1} {Document your decision making why or why not admission, treatments were needed:1} Final Clinical Impression(s) / ED Diagnoses Final diagnoses:  None    Rx / DC Orders ED Discharge Orders     None

## 2022-10-09 NOTE — Discharge Instructions (Addendum)
Thank you for coming to [facility] Emergency Department. You were seen for blood in the urine. We did an exam, labs, and imaging, and these showed blood in the urine and possibly a urinary tract infection. Please go to your CT scan in a few days. You were offered a CT scan here today but elected to wait until your outpatient CT scan. We will treat you with cefpodoxime for your UTI. Please follow up with [facility], you can call them at [phone] to make an appointment in the next 1-2 weeks.  Do not hesitate to return to the ED or call 911 if you experience: -Worsening symptoms -Lightheadedness, passing out -Fevers/chills -Anything else that concerns you

## 2022-10-09 NOTE — ED Notes (Signed)
PT left AMA. MD Mayra Neer made aware. PIV removed cath intact bleeding controlled. UAL to waiting room with paperwork. Pt verb understanding of leaving AMA pending CT scan including death

## 2022-10-10 ENCOUNTER — Telehealth (HOSPITAL_COMMUNITY): Payer: Self-pay | Admitting: Student

## 2022-10-10 LAB — URINE CULTURE: Culture: NO GROWTH

## 2022-10-10 MED ORDER — CEFPODOXIME PROXETIL 200 MG PO TABS: 200.0000 mg | ORAL_TABLET | Freq: Two times a day (BID) | ORAL | 0 refills | Status: AC

## 2022-10-10 NOTE — ED Notes (Signed)
Pt requested prescription be sent to another pharmacy

## 2022-10-10 NOTE — Telephone Encounter (Signed)
Called ED to have prescription sent to different pharmacy. Sent to preferred pharmacy.

## 2024-03-26 ENCOUNTER — Encounter (HOSPITAL_COMMUNITY): Payer: Self-pay | Admitting: Emergency Medicine

## 2024-03-26 ENCOUNTER — Ambulatory Visit (HOSPITAL_COMMUNITY)
Admission: EM | Admit: 2024-03-26 | Discharge: 2024-03-26 | Disposition: A | Discharge status: Home / Self Care | Attending: Family Medicine | Admitting: Family Medicine

## 2024-03-26 DIAGNOSIS — I10 Essential (primary) hypertension: Secondary | ICD-10-CM

## 2024-03-26 DIAGNOSIS — R42 Dizziness and giddiness: Secondary | ICD-10-CM | POA: Diagnosis not present

## 2024-03-26 HISTORY — DX: Pure hypercholesterolemia, unspecified: E78.00

## 2024-03-26 HISTORY — DX: Essential (primary) hypertension: I10

## 2024-03-26 NOTE — ED Provider Notes (Signed)
 [facility]   [id number] 03/26/24 Arrival Time: 1149  ASSESSMENT & PLAN:  1. Episode of dizziness   2. Elevated blood pressure reading in office with diagnosis of hypertension    Feeling normal now. Work note provided. Discussed elevated BP. Is taking medications.    Discharge Instructions      Your blood pressure was noted to be elevated during your visit today. If you are currently taking medication for high blood pressure, please ensure you are taking this as directed. If you do not have a history of high blood pressure and your blood pressure remains persistently elevated, you may need to begin taking a medication at some point. You may return here within the next few days to recheck if unable to see your primary care provider or if you do not have a one.  BP (!) 190/62 (BP Location: Left Arm)   Pulse 70   Temp 99.2 F (37.3 C) (Oral)   Resp 17   SpO2 97%   BP Readings from Last 3 Encounters:  03/26/24 (!) 190/62  10/09/22 (!) 162/81  06/15/21 (!) 166/75        Rec f/u with PCP to recheck BP.  Reviewed expectations re: course of current medical issues. Questions answered. Outlined signs and symptoms indicating need for more acute intervention. Understanding verbalized. After Visit Summary given.   SUBJECTIVE: History from: Patient. Jeffery Gamble is a 73 y.o. male. Pt reports last Wed was unloading a trailer in the hot weather. It was 107 degrees inside the trailer. Reports got dizzy. Repots got sent home from work that day due to him having dizziness because he is an older guy. Denies having any dizziness since. Reports that his work making him be seen and get cleared to return to work before he can come back   OBJECTIVE:  Vitals:   03/26/24 1211  BP: (!) 190/62  Pulse: 70  Resp: 17  Temp: 99.2 F (37.3 C)  TempSrc: Oral  SpO2: 97%    General appearance: alert; no distress Eyes: PERRLA; EOMI; conjunctiva normal HENT: Seltzer; AT; Neck: supple   CV: RRR Lungs: speaks full sentences without difficulty; unlabored Extremities: no edema Skin: warm and dry Neurologic: normal gait Psychological: alert and cooperative; normal mood and affect  Labs:  Labs Reviewed - No data to display  Imaging: No results found.  No Known Allergies  Past Medical History:  Diagnosis Date   High cholesterol    Hypertension    Social History   Socioeconomic History   Marital status: Single    Spouse name: Not on file   Number of children: Not on file   Years of education: Not on file   Highest education level: Not on file  Occupational History   Not on file  Tobacco Use   Smoking status: Every Day    Current packs/day: 1.00    Types: Cigarettes   Smokeless tobacco: Never  Vaping Use   Vaping status: Never Used  Substance and Sexual Activity   Alcohol use: Yes    Alcohol/week: 24.0 standard drinks of alcohol    Types: 24 Cans of beer per week   Drug use: No   Sexual activity: Not on file  Other Topics Concern   Not on file  Social History Narrative   Not on file   Social Drivers of Health   Financial Resource Strain: Not on file  Food Insecurity: Not on file  Transportation Needs: Not on file  Physical Activity: Not  on file  Stress: Not on file  Social Connections: Unknown (01/02/2022)   Received from [facility]   Social Network    Social Network: Not on file  Intimate Partner Violence: Not At Risk (05/14/2023)   Received from [facility]   HITS    Over the last 12 months how often did your partner physically hurt you?: Never    Over the last 12 months how often did your partner insult you or talk down to you?: Never    Over the last 12 months how often did your partner threaten you with physical harm?: Never    Over the last 12 months how often did your partner scream or curse at you?: Never   No family history on file. History reviewed. No pertinent surgical history.   Rolinda Rogue, MD 03/26/24 1501

## 2024-03-26 NOTE — Discharge Instructions (Signed)
 Your blood pressure was noted to be elevated during your visit today. If you are currently taking medication for high blood pressure, please ensure you are taking this as directed. If you do not have a history of high blood pressure and your blood pressure remains persistently elevated, you may need to begin taking a medication at some point. You may return here within the next few days to recheck if unable to see your primary care provider or if you do not have a one.  BP (!) 190/62 (BP Location: Left Arm)   Pulse 70   Temp 99.2 F (37.3 C) (Oral)   Resp 17   SpO2 97%   BP Readings from Last 3 Encounters:  03/26/24 (!) 190/62  10/09/22 (!) 162/81  06/15/21 (!) 166/75

## 2024-03-26 NOTE — ED Triage Notes (Addendum)
 Pt reports last Wed was unloading a trailer in the hot weather. It was 107 degrees inside the trailer. Reports got dizzy. Repots got sent home from work that day due to him having dizziness because he is an older guy. Denies having any dizziness since. Reports that his work making him be seen and get cleared to return to work before he can come back
